# Patient Record
Sex: Female | Born: 1935 | Race: White | Hispanic: No | Smoking: Former smoker
Health system: Southern US, Community
[De-identification: ages and names within clinical notes are randomized; demographics above are authoritative.]

## PROBLEM LIST (undated history)

## (undated) DIAGNOSIS — I219 Acute myocardial infarction, unspecified: Secondary | ICD-10-CM

## (undated) DIAGNOSIS — Z85118 Personal history of other malignant neoplasm of bronchus and lung: Secondary | ICD-10-CM

## (undated) DIAGNOSIS — M810 Age-related osteoporosis without current pathological fracture: Secondary | ICD-10-CM

## (undated) DIAGNOSIS — Z9221 Personal history of antineoplastic chemotherapy: Secondary | ICD-10-CM

## (undated) DIAGNOSIS — I2510 Atherosclerotic heart disease of native coronary artery without angina pectoris: Secondary | ICD-10-CM

## (undated) DIAGNOSIS — K589 Irritable bowel syndrome without diarrhea: Secondary | ICD-10-CM

## (undated) DIAGNOSIS — R413 Other amnesia: Secondary | ICD-10-CM

## (undated) DIAGNOSIS — K5730 Diverticulosis of large intestine without perforation or abscess without bleeding: Secondary | ICD-10-CM

## (undated) DIAGNOSIS — K219 Gastro-esophageal reflux disease without esophagitis: Secondary | ICD-10-CM

## (undated) DIAGNOSIS — Z923 Personal history of irradiation: Secondary | ICD-10-CM

## (undated) DIAGNOSIS — Z87898 Personal history of other specified conditions: Secondary | ICD-10-CM

## (undated) DIAGNOSIS — C3490 Malignant neoplasm of unspecified part of unspecified bronchus or lung: Secondary | ICD-10-CM

## (undated) DIAGNOSIS — J449 Chronic obstructive pulmonary disease, unspecified: Secondary | ICD-10-CM

## (undated) DIAGNOSIS — Z9861 Coronary angioplasty status: Secondary | ICD-10-CM

## (undated) HISTORY — DX: Other amnesia: R41.3

## (undated) HISTORY — DX: Coronary angioplasty status: Z98.61

## (undated) HISTORY — DX: Age-related osteoporosis without current pathological fracture: M81.0

## (undated) HISTORY — DX: Personal history of antineoplastic chemotherapy: Z92.21

## (undated) HISTORY — DX: Personal history of irradiation: Z92.3

## (undated) HISTORY — DX: Personal history of other specified conditions: Z87.898

## (undated) HISTORY — DX: Personal history of other malignant neoplasm of bronchus and lung: Z85.118

## (undated) HISTORY — PX: TONSILLECTOMY AND ADENOIDECTOMY: SHX28

## (undated) HISTORY — DX: Irritable bowel syndrome without diarrhea: K58.9

## (undated) HISTORY — DX: Gastro-esophageal reflux disease without esophagitis: K21.9

## (undated) HISTORY — DX: Atherosclerotic heart disease of native coronary artery without angina pectoris: I25.10

## (undated) HISTORY — DX: Diverticulosis of large intestine without perforation or abscess without bleeding: K57.30

---

## 1978-07-12 HISTORY — PX: ABDOMINAL HYSTERECTOMY: SHX81

## 1995-07-13 DIAGNOSIS — I219 Acute myocardial infarction, unspecified: Secondary | ICD-10-CM

## 1995-07-13 HISTORY — PX: CORONARY ARTERY BYPASS GRAFT: SHX141

## 1995-07-13 HISTORY — DX: Acute myocardial infarction, unspecified: I21.9

## 1998-04-24 ENCOUNTER — Ambulatory Visit (HOSPITAL_COMMUNITY): Admission: RE | Admit: 1998-04-24 | Discharge: 1998-04-24 | Payer: Self-pay | Admitting: *Deleted

## 1999-01-09 ENCOUNTER — Encounter: Payer: Self-pay | Admitting: Internal Medicine

## 1999-01-09 ENCOUNTER — Ambulatory Visit (HOSPITAL_COMMUNITY): Admission: RE | Admit: 1999-01-09 | Discharge: 1999-01-09 | Payer: Self-pay | Admitting: Internal Medicine

## 1999-01-09 HISTORY — PX: BRONCHOSCOPY: SUR163

## 1999-02-26 ENCOUNTER — Encounter (HOSPITAL_COMMUNITY): Admission: RE | Admit: 1999-02-26 | Discharge: 1999-05-27 | Payer: Self-pay | Admitting: Internal Medicine

## 1999-03-04 ENCOUNTER — Ambulatory Visit (HOSPITAL_COMMUNITY): Admission: RE | Admit: 1999-03-04 | Discharge: 1999-03-04 | Payer: Self-pay | Admitting: Internal Medicine

## 1999-03-04 ENCOUNTER — Encounter: Payer: Self-pay | Admitting: Internal Medicine

## 1999-05-05 ENCOUNTER — Encounter: Payer: Self-pay | Admitting: *Deleted

## 1999-05-05 ENCOUNTER — Ambulatory Visit (HOSPITAL_COMMUNITY): Admission: RE | Admit: 1999-05-05 | Discharge: 1999-05-05 | Payer: Self-pay | Admitting: *Deleted

## 1999-05-28 ENCOUNTER — Encounter (HOSPITAL_COMMUNITY): Admission: RE | Admit: 1999-05-28 | Discharge: 1999-08-26 | Payer: Self-pay | Admitting: Internal Medicine

## 1999-10-12 ENCOUNTER — Other Ambulatory Visit: Admission: RE | Admit: 1999-10-12 | Discharge: 1999-10-12 | Payer: Self-pay | Admitting: *Deleted

## 1999-10-20 ENCOUNTER — Encounter: Payer: Self-pay | Admitting: *Deleted

## 1999-10-20 ENCOUNTER — Encounter: Admission: RE | Admit: 1999-10-20 | Discharge: 1999-10-20 | Payer: Self-pay | Admitting: *Deleted

## 2000-05-23 ENCOUNTER — Ambulatory Visit (HOSPITAL_COMMUNITY): Admission: RE | Admit: 2000-05-23 | Discharge: 2000-05-23 | Payer: Self-pay | Admitting: *Deleted

## 2000-05-23 ENCOUNTER — Encounter: Payer: Self-pay | Admitting: *Deleted

## 2000-07-12 DIAGNOSIS — Z85118 Personal history of other malignant neoplasm of bronchus and lung: Secondary | ICD-10-CM

## 2000-07-12 HISTORY — DX: Personal history of other malignant neoplasm of bronchus and lung: Z85.118

## 2000-10-12 ENCOUNTER — Other Ambulatory Visit: Admission: RE | Admit: 2000-10-12 | Discharge: 2000-10-12 | Payer: Self-pay | Admitting: *Deleted

## 2000-11-30 ENCOUNTER — Encounter: Payer: Self-pay | Admitting: Internal Medicine

## 2000-11-30 ENCOUNTER — Ambulatory Visit (HOSPITAL_COMMUNITY): Admission: RE | Admit: 2000-11-30 | Discharge: 2000-11-30 | Payer: Self-pay | Admitting: Internal Medicine

## 2000-11-30 ENCOUNTER — Encounter (INDEPENDENT_AMBULATORY_CARE_PROVIDER_SITE_OTHER): Payer: Self-pay | Admitting: Specialist

## 2000-12-08 ENCOUNTER — Encounter: Payer: Self-pay | Admitting: *Deleted

## 2000-12-08 ENCOUNTER — Encounter: Admission: RE | Admit: 2000-12-08 | Discharge: 2000-12-08 | Payer: Self-pay | Admitting: *Deleted

## 2001-02-27 ENCOUNTER — Ambulatory Visit: Admission: RE | Admit: 2001-02-27 | Discharge: 2001-05-28 | Payer: Self-pay | Admitting: Radiation Oncology

## 2001-06-21 ENCOUNTER — Ambulatory Visit (HOSPITAL_COMMUNITY): Admission: RE | Admit: 2001-06-21 | Discharge: 2001-06-21 | Payer: Self-pay | Admitting: Hematology & Oncology

## 2001-06-21 ENCOUNTER — Encounter: Payer: Self-pay | Admitting: Hematology & Oncology

## 2001-09-01 ENCOUNTER — Ambulatory Visit (HOSPITAL_COMMUNITY): Admission: RE | Admit: 2001-09-01 | Discharge: 2001-09-01 | Payer: Self-pay | Admitting: *Deleted

## 2001-09-01 ENCOUNTER — Encounter: Payer: Self-pay | Admitting: *Deleted

## 2001-09-13 ENCOUNTER — Encounter: Admission: RE | Admit: 2001-09-13 | Discharge: 2001-09-13 | Payer: Self-pay | Admitting: *Deleted

## 2001-09-13 ENCOUNTER — Encounter: Payer: Self-pay | Admitting: *Deleted

## 2002-04-18 ENCOUNTER — Encounter: Payer: Self-pay | Admitting: Hematology & Oncology

## 2002-04-18 ENCOUNTER — Ambulatory Visit (HOSPITAL_COMMUNITY): Admission: RE | Admit: 2002-04-18 | Discharge: 2002-04-18 | Payer: Self-pay | Admitting: Hematology & Oncology

## 2002-06-18 ENCOUNTER — Encounter: Payer: Self-pay | Admitting: *Deleted

## 2002-06-18 ENCOUNTER — Encounter: Admission: RE | Admit: 2002-06-18 | Discharge: 2002-06-18 | Payer: Self-pay | Admitting: *Deleted

## 2003-07-17 ENCOUNTER — Ambulatory Visit (HOSPITAL_COMMUNITY): Admission: RE | Admit: 2003-07-17 | Discharge: 2003-07-17 | Payer: Self-pay | Admitting: Hematology & Oncology

## 2003-07-22 ENCOUNTER — Ambulatory Visit (HOSPITAL_COMMUNITY): Admission: RE | Admit: 2003-07-22 | Discharge: 2003-07-22 | Payer: Self-pay | Admitting: *Deleted

## 2004-06-08 ENCOUNTER — Other Ambulatory Visit: Admission: RE | Admit: 2004-06-08 | Discharge: 2004-06-08 | Payer: Self-pay | Admitting: *Deleted

## 2004-06-19 ENCOUNTER — Ambulatory Visit: Payer: Self-pay | Admitting: Hematology & Oncology

## 2004-07-12 DIAGNOSIS — R413 Other amnesia: Secondary | ICD-10-CM

## 2004-07-12 HISTORY — DX: Other amnesia: R41.3

## 2004-08-24 ENCOUNTER — Ambulatory Visit (HOSPITAL_COMMUNITY): Admission: RE | Admit: 2004-08-24 | Discharge: 2004-08-24 | Payer: Self-pay | Admitting: *Deleted

## 2004-10-16 ENCOUNTER — Ambulatory Visit: Payer: Self-pay | Admitting: Hematology & Oncology

## 2005-01-23 DIAGNOSIS — R413 Other amnesia: Secondary | ICD-10-CM | POA: Insufficient documentation

## 2005-03-09 ENCOUNTER — Ambulatory Visit: Payer: Self-pay | Admitting: Hematology & Oncology

## 2005-05-06 DIAGNOSIS — K5730 Diverticulosis of large intestine without perforation or abscess without bleeding: Secondary | ICD-10-CM

## 2005-05-06 HISTORY — DX: Diverticulosis of large intestine without perforation or abscess without bleeding: K57.30

## 2005-07-16 ENCOUNTER — Ambulatory Visit: Payer: Self-pay | Admitting: Hematology & Oncology

## 2005-10-18 ENCOUNTER — Ambulatory Visit (HOSPITAL_COMMUNITY): Admission: RE | Admit: 2005-10-18 | Discharge: 2005-10-18 | Payer: Self-pay | Admitting: Obstetrics and Gynecology

## 2005-10-22 ENCOUNTER — Ambulatory Visit: Payer: Self-pay | Admitting: Hematology & Oncology

## 2005-10-25 LAB — CBC WITH DIFFERENTIAL/PLATELET
BASO%: 0.3 % (ref 0.0–2.0)
HCT: 44.8 % (ref 34.8–46.6)
MCHC: 34.2 g/dL (ref 32.0–36.0)
MONO#: 0.3 10*3/uL (ref 0.1–0.9)
RBC: 5.06 10*6/uL (ref 3.70–5.32)
WBC: 5.3 10*3/uL (ref 3.9–10.0)
lymph#: 1.9 10*3/uL (ref 0.9–3.3)

## 2005-10-25 LAB — COMPREHENSIVE METABOLIC PANEL
ALT: 15 U/L (ref 0–40)
Albumin: 4.3 g/dL (ref 3.5–5.2)
CO2: 26 mEq/L (ref 19–32)
Calcium: 9.2 mg/dL (ref 8.4–10.5)
Chloride: 107 mEq/L (ref 96–112)
Sodium: 141 mEq/L (ref 135–145)
Total Protein: 6.4 g/dL (ref 6.0–8.3)

## 2006-04-21 ENCOUNTER — Ambulatory Visit: Payer: Self-pay | Admitting: Hematology & Oncology

## 2006-05-06 ENCOUNTER — Ambulatory Visit (HOSPITAL_COMMUNITY): Admission: RE | Admit: 2006-05-06 | Discharge: 2006-05-06 | Payer: Self-pay | Admitting: Hematology & Oncology

## 2006-06-24 ENCOUNTER — Other Ambulatory Visit: Admission: RE | Admit: 2006-06-24 | Discharge: 2006-06-24 | Payer: Self-pay | Admitting: Obstetrics & Gynecology

## 2006-10-19 ENCOUNTER — Ambulatory Visit: Payer: Self-pay | Admitting: Hematology & Oncology

## 2006-10-24 LAB — COMPREHENSIVE METABOLIC PANEL
ALT: 13 U/L (ref 0–35)
CO2: 26 mEq/L (ref 19–32)
Chloride: 105 mEq/L (ref 96–112)
Sodium: 140 mEq/L (ref 135–145)
Total Bilirubin: 0.4 mg/dL (ref 0.3–1.2)
Total Protein: 6.2 g/dL (ref 6.0–8.3)

## 2006-10-24 LAB — CBC WITH DIFFERENTIAL/PLATELET
BASO%: 0.5 % (ref 0.0–2.0)
LYMPH%: 30.6 % (ref 14.0–48.0)
MCHC: 35.3 g/dL (ref 32.0–36.0)
MONO#: 0.3 10*3/uL (ref 0.1–0.9)
RBC: 4.71 10*6/uL (ref 3.70–5.32)
WBC: 5.1 10*3/uL (ref 3.9–10.0)
lymph#: 1.5 10*3/uL (ref 0.9–3.3)

## 2006-11-04 ENCOUNTER — Ambulatory Visit (HOSPITAL_COMMUNITY): Admission: RE | Admit: 2006-11-04 | Discharge: 2006-11-04 | Payer: Self-pay | Admitting: Obstetrics & Gynecology

## 2007-03-21 ENCOUNTER — Emergency Department (HOSPITAL_COMMUNITY): Admission: EM | Admit: 2007-03-21 | Discharge: 2007-03-21 | Payer: Self-pay | Admitting: *Deleted

## 2007-03-21 HISTORY — PX: CLOSED REDUCTION FOREARM FRACTURE: SHX960

## 2007-03-31 ENCOUNTER — Ambulatory Visit (HOSPITAL_COMMUNITY): Admission: RE | Admit: 2007-03-31 | Discharge: 2007-03-31 | Payer: Self-pay | Admitting: Cardiology

## 2007-04-19 ENCOUNTER — Ambulatory Visit (HOSPITAL_COMMUNITY): Admission: RE | Admit: 2007-04-19 | Discharge: 2007-04-19 | Payer: Self-pay | Admitting: Cardiology

## 2007-04-19 ENCOUNTER — Ambulatory Visit: Payer: Self-pay | Admitting: Cardiovascular Disease

## 2007-04-20 ENCOUNTER — Ambulatory Visit: Payer: Self-pay | Admitting: Hematology & Oncology

## 2007-04-24 LAB — COMPREHENSIVE METABOLIC PANEL
ALT: 14 U/L (ref 0–35)
AST: 15 U/L (ref 0–37)
Alkaline Phosphatase: 45 U/L (ref 39–117)
Sodium: 138 mEq/L (ref 135–145)
Total Bilirubin: 0.4 mg/dL (ref 0.3–1.2)
Total Protein: 6.1 g/dL (ref 6.0–8.3)

## 2007-04-24 LAB — CBC WITH DIFFERENTIAL/PLATELET
BASO%: 0.4 % (ref 0.0–2.0)
EOS%: 3 % (ref 0.0–7.0)
LYMPH%: 23.1 % (ref 14.0–48.0)
MCH: 31 pg (ref 26.0–34.0)
MCHC: 35.1 g/dL (ref 32.0–36.0)
MCV: 88.5 fL (ref 81.0–101.0)
MONO#: 0.3 10*3/uL (ref 0.1–0.9)
MONO%: 6.4 % (ref 0.0–13.0)
Platelets: 190 10*3/uL (ref 145–400)
RBC: 4.46 10*6/uL (ref 3.70–5.32)
WBC: 4.8 10*3/uL (ref 3.9–10.0)

## 2007-05-29 ENCOUNTER — Ambulatory Visit (HOSPITAL_COMMUNITY): Admission: RE | Admit: 2007-05-29 | Discharge: 2007-05-29 | Payer: Self-pay | Admitting: Neurology

## 2007-07-13 DIAGNOSIS — Z87898 Personal history of other specified conditions: Secondary | ICD-10-CM

## 2007-07-13 HISTORY — DX: Personal history of other specified conditions: Z87.898

## 2007-07-26 ENCOUNTER — Other Ambulatory Visit: Admission: RE | Admit: 2007-07-26 | Discharge: 2007-07-26 | Payer: Self-pay | Admitting: Obstetrics and Gynecology

## 2007-11-08 ENCOUNTER — Ambulatory Visit: Payer: Self-pay | Admitting: Hematology & Oncology

## 2007-11-13 LAB — CBC WITH DIFFERENTIAL/PLATELET
BASO%: 1 % (ref 0.0–2.0)
Eosinophils Absolute: 0.1 10*3/uL (ref 0.0–0.5)
HCT: 41.9 % (ref 34.8–46.6)
HGB: 14.5 g/dL (ref 11.6–15.9)
MCHC: 34.5 g/dL (ref 32.0–36.0)
MONO#: 0.3 10*3/uL (ref 0.1–0.9)
NEUT#: 2.5 10*3/uL (ref 1.5–6.5)
NEUT%: 57.4 % (ref 39.6–76.8)
WBC: 4.3 10*3/uL (ref 3.9–10.0)
lymph#: 1.4 10*3/uL (ref 0.9–3.3)

## 2007-11-13 LAB — COMPREHENSIVE METABOLIC PANEL
ALT: 21 U/L (ref 0–35)
CO2: 26 mEq/L (ref 19–32)
Calcium: 9.3 mg/dL (ref 8.4–10.5)
Chloride: 107 mEq/L (ref 96–112)
Creatinine, Ser: 0.98 mg/dL (ref 0.40–1.20)
Glucose, Bld: 61 mg/dL — ABNORMAL LOW (ref 70–99)
Total Protein: 6.1 g/dL (ref 6.0–8.3)

## 2007-11-20 ENCOUNTER — Ambulatory Visit (HOSPITAL_COMMUNITY): Admission: RE | Admit: 2007-11-20 | Discharge: 2007-11-20 | Payer: Self-pay | Admitting: Obstetrics & Gynecology

## 2007-11-20 ENCOUNTER — Encounter: Payer: Self-pay | Admitting: Internal Medicine

## 2007-11-22 ENCOUNTER — Other Ambulatory Visit: Admission: RE | Admit: 2007-11-22 | Discharge: 2007-11-22 | Payer: Self-pay | Admitting: Obstetrics & Gynecology

## 2008-01-19 ENCOUNTER — Ambulatory Visit: Payer: Self-pay | Admitting: Internal Medicine

## 2008-01-19 DIAGNOSIS — I219 Acute myocardial infarction, unspecified: Secondary | ICD-10-CM | POA: Insufficient documentation

## 2008-01-19 DIAGNOSIS — M810 Age-related osteoporosis without current pathological fracture: Secondary | ICD-10-CM | POA: Insufficient documentation

## 2008-01-19 DIAGNOSIS — C3492 Malignant neoplasm of unspecified part of left bronchus or lung: Secondary | ICD-10-CM

## 2008-01-19 DIAGNOSIS — J438 Other emphysema: Secondary | ICD-10-CM

## 2008-01-27 DIAGNOSIS — J449 Chronic obstructive pulmonary disease, unspecified: Secondary | ICD-10-CM

## 2008-01-27 DIAGNOSIS — J4489 Other specified chronic obstructive pulmonary disease: Secondary | ICD-10-CM | POA: Insufficient documentation

## 2008-01-29 ENCOUNTER — Encounter: Payer: Self-pay | Admitting: Adult Health

## 2008-01-29 ENCOUNTER — Telehealth (INDEPENDENT_AMBULATORY_CARE_PROVIDER_SITE_OTHER): Payer: Self-pay | Admitting: *Deleted

## 2008-03-01 ENCOUNTER — Ambulatory Visit: Payer: Self-pay | Admitting: Internal Medicine

## 2008-05-10 ENCOUNTER — Ambulatory Visit: Payer: Self-pay | Admitting: Hematology & Oncology

## 2008-05-20 LAB — COMPREHENSIVE METABOLIC PANEL
BUN: 19 mg/dL (ref 6–23)
CO2: 26 mEq/L (ref 19–32)
Calcium: 9.4 mg/dL (ref 8.4–10.5)
Chloride: 102 mEq/L (ref 96–112)
Creatinine, Ser: 0.94 mg/dL (ref 0.40–1.20)
Glucose, Bld: 77 mg/dL (ref 70–99)
Total Bilirubin: 0.5 mg/dL (ref 0.3–1.2)

## 2008-05-20 LAB — CBC WITH DIFFERENTIAL/PLATELET
Basophils Absolute: 0 10*3/uL (ref 0.0–0.1)
HCT: 45.5 % (ref 34.8–46.6)
HGB: 15.5 g/dL (ref 11.6–15.9)
LYMPH%: 31 % (ref 14.0–48.0)
MONO#: 0.4 10*3/uL (ref 0.1–0.9)
NEUT%: 61 % (ref 39.6–76.8)
Platelets: 173 10*3/uL (ref 145–400)
WBC: 5.7 10*3/uL (ref 3.9–10.0)
lymph#: 1.8 10*3/uL (ref 0.9–3.3)

## 2008-05-24 ENCOUNTER — Ambulatory Visit: Payer: Self-pay | Admitting: Hematology & Oncology

## 2008-07-12 DIAGNOSIS — K589 Irritable bowel syndrome without diarrhea: Secondary | ICD-10-CM

## 2008-07-12 HISTORY — DX: Irritable bowel syndrome, unspecified: K58.9

## 2008-07-26 ENCOUNTER — Other Ambulatory Visit: Admission: RE | Admit: 2008-07-26 | Discharge: 2008-07-26 | Payer: Self-pay | Admitting: Obstetrics & Gynecology

## 2008-07-30 ENCOUNTER — Encounter: Admission: RE | Admit: 2008-07-30 | Discharge: 2008-07-30 | Payer: Self-pay | Admitting: Obstetrics & Gynecology

## 2008-11-18 ENCOUNTER — Ambulatory Visit (HOSPITAL_COMMUNITY): Admission: RE | Admit: 2008-11-18 | Discharge: 2008-11-18 | Payer: Self-pay | Admitting: Obstetrics & Gynecology

## 2008-11-22 ENCOUNTER — Ambulatory Visit: Payer: Self-pay | Admitting: Hematology & Oncology

## 2008-11-25 ENCOUNTER — Ambulatory Visit (HOSPITAL_BASED_OUTPATIENT_CLINIC_OR_DEPARTMENT_OTHER): Admission: RE | Admit: 2008-11-25 | Discharge: 2008-11-25 | Payer: Self-pay | Admitting: Hematology & Oncology

## 2008-11-25 ENCOUNTER — Ambulatory Visit: Payer: Self-pay | Admitting: Diagnostic Radiology

## 2008-11-25 LAB — CMP (CANCER CENTER ONLY)
ALT(SGPT): 19 U/L (ref 10–47)
AST: 24 U/L (ref 11–38)
BUN, Bld: 19 mg/dL (ref 7–22)
CO2: 31 mEq/L (ref 18–33)
Calcium: 9.8 mg/dL (ref 8.0–10.3)
Chloride: 100 mEq/L (ref 98–108)
Creat: 1 mg/dl (ref 0.6–1.2)
Total Bilirubin: 0.7 mg/dl (ref 0.20–1.60)

## 2008-11-25 LAB — CBC WITH DIFFERENTIAL (CANCER CENTER ONLY)
BASO%: 0.6 % (ref 0.0–2.0)
HCT: 43.1 % (ref 34.8–46.6)
LYMPH%: 35.5 % (ref 14.0–48.0)
MCH: 30.6 pg (ref 26.0–34.0)
MCV: 90 fL (ref 81–101)
MONO%: 6.8 % (ref 0.0–13.0)
NEUT%: 54.7 % (ref 39.6–80.0)
Platelets: 168 10*3/uL (ref 145–400)
RDW: 11.2 % (ref 10.5–14.6)

## 2009-05-12 ENCOUNTER — Ambulatory Visit: Payer: Self-pay | Admitting: Hematology & Oncology

## 2009-05-14 LAB — CBC WITH DIFFERENTIAL/PLATELET
BASO%: 0.3 % (ref 0.0–2.0)
EOS%: 2.1 % (ref 0.0–7.0)
MCH: 30.6 pg (ref 25.1–34.0)
MCHC: 33.1 g/dL (ref 31.5–36.0)
MONO#: 0.5 10*3/uL (ref 0.1–0.9)
RBC: 4.89 10*6/uL (ref 3.70–5.45)
RDW: 13 % (ref 11.2–14.5)
WBC: 7.4 10*3/uL (ref 3.9–10.3)
lymph#: 2.1 10*3/uL (ref 0.9–3.3)

## 2009-05-15 LAB — COMPREHENSIVE METABOLIC PANEL
ALT: 14 U/L (ref 0–35)
AST: 17 U/L (ref 0–37)
CO2: 29 mEq/L (ref 19–32)
Calcium: 9.5 mg/dL (ref 8.4–10.5)
Chloride: 102 mEq/L (ref 96–112)
Creatinine, Ser: 1.01 mg/dL (ref 0.40–1.20)
Sodium: 138 mEq/L (ref 135–145)
Total Protein: 6.5 g/dL (ref 6.0–8.3)

## 2009-05-15 LAB — VITAMIN D 25 HYDROXY (VIT D DEFICIENCY, FRACTURES): Vit D, 25-Hydroxy: 38 ng/mL (ref 30–89)

## 2009-05-19 ENCOUNTER — Encounter: Admission: RE | Admit: 2009-05-19 | Discharge: 2009-05-19 | Payer: Self-pay | Admitting: Hematology & Oncology

## 2009-05-27 ENCOUNTER — Ambulatory Visit: Payer: Self-pay | Admitting: Hematology & Oncology

## 2009-08-08 ENCOUNTER — Ambulatory Visit (HOSPITAL_COMMUNITY): Admission: RE | Admit: 2009-08-08 | Discharge: 2009-08-08 | Payer: Self-pay | Admitting: Obstetrics & Gynecology

## 2009-08-26 ENCOUNTER — Inpatient Hospital Stay (HOSPITAL_COMMUNITY): Admission: EM | Admit: 2009-08-26 | Discharge: 2009-09-01 | Payer: Self-pay | Admitting: Emergency Medicine

## 2009-08-27 HISTORY — PX: ORIF FEMUR FRACTURE: SHX2119

## 2009-11-20 ENCOUNTER — Ambulatory Visit: Payer: Self-pay | Admitting: Hematology & Oncology

## 2009-11-24 ENCOUNTER — Ambulatory Visit: Payer: Self-pay | Admitting: Diagnostic Radiology

## 2009-11-24 ENCOUNTER — Ambulatory Visit (HOSPITAL_BASED_OUTPATIENT_CLINIC_OR_DEPARTMENT_OTHER): Admission: RE | Admit: 2009-11-24 | Discharge: 2009-11-24 | Payer: Self-pay | Admitting: Hematology & Oncology

## 2009-11-24 LAB — COMPREHENSIVE METABOLIC PANEL
ALT: 14 U/L (ref 0–35)
CO2: 27 mEq/L (ref 19–32)
Calcium: 9.6 mg/dL (ref 8.4–10.5)
Chloride: 103 mEq/L (ref 96–112)
Creatinine, Ser: 0.86 mg/dL (ref 0.40–1.20)
Glucose, Bld: 79 mg/dL (ref 70–99)
Sodium: 137 mEq/L (ref 135–145)
Total Protein: 7 g/dL (ref 6.0–8.3)

## 2009-11-24 LAB — CBC WITH DIFFERENTIAL (CANCER CENTER ONLY)
BASO%: 0.6 % (ref 0.0–2.0)
Eosinophils Absolute: 0.2 10*3/uL (ref 0.0–0.5)
HCT: 43.6 % (ref 34.8–46.6)
LYMPH#: 1.7 10*3/uL (ref 0.9–3.3)
MONO#: 0.3 10*3/uL (ref 0.1–0.9)
NEUT%: 50 % (ref 39.6–80.0)
RBC: 4.97 10*6/uL (ref 3.70–5.32)
RDW: 12.3 % (ref 10.5–14.6)
WBC: 4.3 10*3/uL (ref 3.9–10.0)

## 2009-11-24 LAB — VITAMIN D 25 HYDROXY (VIT D DEFICIENCY, FRACTURES): Vit D, 25-Hydroxy: 33 ng/mL (ref 30–89)

## 2010-03-18 ENCOUNTER — Ambulatory Visit: Payer: Self-pay | Admitting: Hematology & Oncology

## 2010-05-22 ENCOUNTER — Ambulatory Visit: Payer: Self-pay | Admitting: Hematology & Oncology

## 2010-05-25 ENCOUNTER — Ambulatory Visit: Payer: Self-pay | Admitting: Diagnostic Radiology

## 2010-05-25 ENCOUNTER — Ambulatory Visit (HOSPITAL_BASED_OUTPATIENT_CLINIC_OR_DEPARTMENT_OTHER): Admission: RE | Admit: 2010-05-25 | Discharge: 2010-05-25 | Payer: Self-pay | Admitting: Hematology & Oncology

## 2010-05-25 LAB — COMPREHENSIVE METABOLIC PANEL
ALT: 18 U/L (ref 0–35)
AST: 21 U/L (ref 0–37)
Albumin: 3.9 g/dL (ref 3.5–5.2)
Calcium: 9.4 mg/dL (ref 8.4–10.5)
Chloride: 105 mEq/L (ref 96–112)
Potassium: 4.3 mEq/L (ref 3.5–5.3)
Sodium: 140 mEq/L (ref 135–145)

## 2010-05-25 LAB — CBC WITH DIFFERENTIAL (CANCER CENTER ONLY)
BASO%: 0.5 % (ref 0.0–2.0)
EOS%: 3.3 % (ref 0.0–7.0)
LYMPH#: 2.2 10*3/uL (ref 0.9–3.3)
MCHC: 33 g/dL (ref 32.0–36.0)
NEUT#: 2.3 10*3/uL (ref 1.5–6.5)
RDW: 11.4 % (ref 10.5–14.6)

## 2010-08-01 ENCOUNTER — Encounter: Payer: Self-pay | Admitting: Hematology & Oncology

## 2010-08-02 ENCOUNTER — Encounter: Payer: Self-pay | Admitting: Hematology & Oncology

## 2010-09-02 ENCOUNTER — Other Ambulatory Visit: Payer: Self-pay | Admitting: Obstetrics and Gynecology

## 2010-09-02 DIAGNOSIS — Z1231 Encounter for screening mammogram for malignant neoplasm of breast: Secondary | ICD-10-CM

## 2010-09-21 ENCOUNTER — Ambulatory Visit (HOSPITAL_COMMUNITY)
Admission: RE | Admit: 2010-09-21 | Discharge: 2010-09-21 | Disposition: A | Discharge status: Home / Self Care | Payer: Medicare Other | Source: Ambulatory Visit | Attending: Obstetrics and Gynecology | Admitting: Obstetrics and Gynecology

## 2010-09-21 DIAGNOSIS — Z1231 Encounter for screening mammogram for malignant neoplasm of breast: Secondary | ICD-10-CM | POA: Insufficient documentation

## 2010-09-30 LAB — CBC
HCT: 33.5 % — ABNORMAL LOW (ref 36.0–46.0)
HCT: 34 % — ABNORMAL LOW (ref 36.0–46.0)
HCT: 34.6 % — ABNORMAL LOW (ref 36.0–46.0)
HCT: 39.1 % (ref 36.0–46.0)
Hemoglobin: 11.8 g/dL — ABNORMAL LOW (ref 12.0–15.0)
Hemoglobin: 11.8 g/dL — ABNORMAL LOW (ref 12.0–15.0)
Hemoglobin: 13.6 g/dL (ref 12.0–15.0)
MCHC: 34.1 g/dL (ref 30.0–36.0)
MCHC: 34.3 g/dL (ref 30.0–36.0)
MCHC: 34.6 g/dL (ref 30.0–36.0)
MCV: 88.7 fL (ref 78.0–100.0)
MCV: 88.8 fL (ref 78.0–100.0)
MCV: 89 fL (ref 78.0–100.0)
MCV: 89 fL (ref 78.0–100.0)
MCV: 89.2 fL (ref 78.0–100.0)
Platelets: 139 10*3/uL — ABNORMAL LOW (ref 150–400)
Platelets: 153 10*3/uL (ref 150–400)
Platelets: 157 10*3/uL (ref 150–400)
RBC: 4.73 MIL/uL (ref 3.87–5.11)
RDW: 13.8 % (ref 11.5–15.5)
RDW: 14.1 % (ref 11.5–15.5)
RDW: 14.6 % (ref 11.5–15.5)
WBC: 8.1 10*3/uL (ref 4.0–10.5)

## 2010-09-30 LAB — BASIC METABOLIC PANEL
BUN: 10 mg/dL (ref 6–23)
BUN: 13 mg/dL (ref 6–23)
CO2: 31 mEq/L (ref 19–32)
CO2: 31 mEq/L (ref 19–32)
Chloride: 101 mEq/L (ref 96–112)
Chloride: 102 mEq/L (ref 96–112)
Chloride: 102 mEq/L (ref 96–112)
Creatinine, Ser: 0.81 mg/dL (ref 0.4–1.2)
Creatinine, Ser: 0.85 mg/dL (ref 0.4–1.2)
GFR calc Af Amer: >60 mL/min (ref >60)
GFR calc non Af Amer: >60 mL/min (ref >60)
Glucose, Bld: 113 mg/dL — ABNORMAL HIGH (ref 70–99)
Glucose, Bld: 121 mg/dL — ABNORMAL HIGH (ref 70–99)
Glucose, Bld: 140 mg/dL — ABNORMAL HIGH (ref 70–99)
Potassium: 4.7 mEq/L (ref 3.5–5.1)
Potassium: 4.7 mEq/L (ref 3.5–5.1)
Sodium: 136 mEq/L (ref 135–145)
Sodium: 136 mEq/L (ref 135–145)

## 2010-09-30 LAB — T4, FREE: Free T4: 1.26 ng/dL (ref 0.80–1.80)

## 2010-09-30 LAB — APTT: aPTT: 33 seconds (ref 24–37)

## 2010-09-30 LAB — COMPREHENSIVE METABOLIC PANEL
AST: 36 U/L (ref 0–37)
CO2: 26 mEq/L (ref 19–32)
Calcium: 9.2 mg/dL (ref 8.4–10.5)
Creatinine, Ser: 1.06 mg/dL (ref 0.4–1.2)
GFR calc Af Amer: >60 mL/min (ref >60)
GFR calc non Af Amer: 50 mL/min — ABNORMAL LOW (ref >60)

## 2010-09-30 LAB — PROTIME-INR
INR: 0.96 (ref 0.00–1.49)
INR: 2.02 — ABNORMAL HIGH (ref 0.00–1.49)
INR: 2.72 — ABNORMAL HIGH (ref 0.00–1.49)
Prothrombin Time: 12.7 seconds (ref 11.6–15.2)
Prothrombin Time: 28.8 seconds — ABNORMAL HIGH (ref 11.6–15.2)

## 2010-09-30 LAB — CK TOTAL AND CKMB (NOT AT ARMC)
CK, MB: 5.2 ng/mL — ABNORMAL HIGH (ref 0.3–4.0)
Relative Index: 2.6 — ABNORMAL HIGH (ref 0.0–2.5)
Relative Index: 3.3 — ABNORMAL HIGH (ref 0.0–2.5)
Relative Index: 3.4 — ABNORMAL HIGH (ref 0.0–2.5)
Total CK: 156 U/L (ref 7–177)
Total CK: 197 U/L — ABNORMAL HIGH (ref 7–177)

## 2010-09-30 LAB — DIFFERENTIAL
Eosinophils Relative: 0 % (ref 0–5)
Lymphocytes Relative: 21 % (ref 12–46)
Lymphs Abs: 0.9 10*3/uL (ref 0.7–4.0)
Neutro Abs: 3.1 10*3/uL (ref 1.7–7.7)
Neutrophils Relative %: 72 % (ref 43–77)

## 2010-09-30 LAB — URINALYSIS, ROUTINE W REFLEX MICROSCOPIC
Leukocytes, UA: NEGATIVE
Nitrite: NEGATIVE
Protein, ur: NEGATIVE mg/dL
Urobilinogen, UA: 0.2 mg/dL (ref 0.0–1.0)

## 2010-09-30 LAB — TROPONIN I
Troponin I: 0.01 ng/mL (ref 0.00–0.06)
Troponin I: 0.02 ng/mL (ref 0.00–0.06)

## 2010-09-30 LAB — URINE MICROSCOPIC-ADD ON

## 2010-09-30 LAB — TSH: TSH: 0.633 u[IU]/mL (ref 0.350–4.500)

## 2010-11-24 NOTE — Op Note (Signed)
NAME:  Nancy Gray, Nancy Gray NO.:  [id number]   MEDICAL RECORD NO.:  [id number]          PATIENT TYPE:  EMS   LOCATION:  ED                           FACILITY:  [facility]   PHYSICIAN:  Dionne Ano. Gramig III, M.D.DATE OF BIRTH:  07/08/1929   DATE OF PROCEDURE:  03/21/2007  DATE OF DISCHARGE:                               OPERATIVE REPORT   HISTORY:  I had the pleasure to see Ms. Nancy Gray who is a 77 year old  female who is right-hand dominant and fell, today, sustaining a  comminuted complex fracture about her distal radius and ulna, left upper  extremity.  The patient does not recall all of the events of her fall,  but notes that she landed on the outstretched left upper extremity; and  has no other injuries.  She denies neck, back, chest, or abdominal pain.  She does have her family with her including her daughter and  granddaughter.  She is alert and oriented x4.  I should note that she  has a significantly displaced comminuted complex distal radius fracture  with obvious ambulatory deformity.  She has some abrasions ulnarly but  no evidence of an open wound.   PAST MEDICAL HISTORY:  Coronary artery disease, pulmonary lesion with  documented cancer history for which she has been treated with  chemotherapy only.  She states that she is doing quite well, and  continuing her chemotherapy regimen under the direction of Dr. Arlan Organ.  She also has a history of COPD; however, she quit smoking 20  years ago.   PAST SURGICAL HISTORY:  Significant for a CABG x4 in with subsequent  clotting of two of the grafts and the stenting which she was done fairly  well.  She sees Dr. Amil Amen for her cardiac needs.   ALLERGIES:  None.   MEDICINES REVIEWED AND INCLUDE:  1. Alprazolam  2. Aricept 10 mg at bedtime.  3. Aspirin 81 mg daily.  4. Benicar.  5. Cipro 750 mg twice a day.  6. Combivent.  7. Evista 16 mg once a day.  8. Flagyl 500 mg twice a day.  9. Magnesium 1  tablet a day.  10.Paxil.  11.Potassium chloride.  12.Zocor.  13.Vitamins.   SOCIAL HISTORY:  She does not smoke.  She lives alone.  She rarely  enjoys an alcoholic beverage.   PHYSICAL EXAMINATION:  GENERAL:  She is a pleasant female alert and  oriented in no acute distress here with her family.  She is obvious  deformity about the left wrist.  CHEST:  Clear.  HEART:  Regular rate.  ABDOMEN:  Nontender.  EXTREMITIES:  Her right upper extremity has no evidence of infection,  dystrophy, or obvious abnormality.  Left upper extremity has obvious  deformity, peripheral abrasion ulnarly at about the volar aspect of her  wrist, but no obvious open fracture.  She is stable to ligamentous  examination; and has no signs of obvious infection or compartment  syndrome.  NEUROLOGIC:  She is sensate about the hand and does have normal flexion  and extension of the  fingers through a very short arc due to pain.   X-RAYS:  Reviewed which show comminuted complex interarticular distal  radius fracture with associated ulna fracture.   IMPRESSION:  Closed comminuted complex interarticular radius and ulna  fracture greater than five parts.   PLAN:  I have discussed her findings, I have given her a hematoma block  with lidocaine approximately 15 mL were used with aspiration of the  hematoma.  Following adequate anesthesia, she was placed.  In finger-  trap traction and underwent reduction.  Postreduction films showed  restoration of anatomic alignment in terms of radial height, inclination  and volar tilt, I was quite pleased with this.  I discussed with the  patient that should she hold, or have only slight collapse then closed  treatment would certainly be in her best interests given all issues,  including her age, activity level, hand dominance, and medical problems.  However, if she displaces significantly, then operative intervention  would be the next consideration.   I have discussed these  issues with her at length.  She was given Vicodin  for pain.  I have asked the family to stay with her over the next 2-3  days.  I have discussed elevation, ice, finger range of motion, massage,  and other measures.  I have discussed with her these issues at length,  the do's and don'ts etcetera, and all questions have been encouraged and  answered.   She left the hospital alert and oriented with excellent range of motion  to the fingers.  She is noted to understand the postop  protocol/postreduction protocol; and will return to see me in 7 days for  repeat AP and lateral x-rays, in my office, with her splint on.  It has  been a pleasure to see her.           ______________________________  Dionne Ano. Everlene Other, M.D.     Nash Mantis  D:  03/21/2007  T:  03/22/2007  Job:  [id number]   cc:   Bethann Berkshire, MD  [address] [place], [place] [address]

## 2010-12-23 ENCOUNTER — Ambulatory Visit (HOSPITAL_BASED_OUTPATIENT_CLINIC_OR_DEPARTMENT_OTHER)
Admission: RE | Admit: 2010-12-23 | Discharge: 2010-12-23 | Disposition: A | Discharge status: Home / Self Care | Payer: Medicare Other | Source: Ambulatory Visit | Attending: Hematology & Oncology | Admitting: Hematology & Oncology

## 2010-12-23 ENCOUNTER — Other Ambulatory Visit: Payer: Self-pay | Admitting: Hematology & Oncology

## 2010-12-23 ENCOUNTER — Encounter (HOSPITAL_BASED_OUTPATIENT_CLINIC_OR_DEPARTMENT_OTHER): Payer: Medicare Other | Admitting: Hematology & Oncology

## 2010-12-23 DIAGNOSIS — J449 Chronic obstructive pulmonary disease, unspecified: Secondary | ICD-10-CM | POA: Insufficient documentation

## 2010-12-23 DIAGNOSIS — C3410 Malignant neoplasm of upper lobe, unspecified bronchus or lung: Secondary | ICD-10-CM

## 2010-12-23 DIAGNOSIS — C801 Malignant (primary) neoplasm, unspecified: Secondary | ICD-10-CM

## 2010-12-23 DIAGNOSIS — C3490 Malignant neoplasm of unspecified part of unspecified bronchus or lung: Secondary | ICD-10-CM

## 2010-12-23 DIAGNOSIS — J4489 Other specified chronic obstructive pulmonary disease: Secondary | ICD-10-CM | POA: Insufficient documentation

## 2010-12-23 DIAGNOSIS — E559 Vitamin D deficiency, unspecified: Secondary | ICD-10-CM

## 2010-12-23 DIAGNOSIS — D509 Iron deficiency anemia, unspecified: Secondary | ICD-10-CM

## 2010-12-23 LAB — CBC WITH DIFFERENTIAL (CANCER CENTER ONLY)
BASO%: 0.4 % (ref 0.0–2.0)
HCT: 42.9 % (ref 34.8–46.6)
LYMPH%: 35.3 % (ref 14.0–48.0)
MCH: 29.8 pg (ref 26.0–34.0)
MCV: 89 fL (ref 81–101)
MONO#: 0.5 10*3/uL (ref 0.1–0.9)
MONO%: 9.2 % (ref 0.0–13.0)
NEUT%: 52.9 % (ref 39.6–80.0)
RDW: 12.8 % (ref 11.1–15.7)
WBC: 5.6 10*3/uL (ref 3.9–10.0)

## 2010-12-23 LAB — VITAMIN D 25 HYDROXY (VIT D DEFICIENCY, FRACTURES): Vit D, 25-Hydroxy: 37 ng/mL (ref 30–89)

## 2010-12-23 LAB — COMPREHENSIVE METABOLIC PANEL
ALT: 17 U/L (ref 0–35)
CO2: 25 mEq/L (ref 19–32)
Creatinine, Ser: 0.98 mg/dL (ref 0.50–1.10)
Glucose, Bld: 81 mg/dL (ref 70–99)
Total Bilirubin: 0.7 mg/dL (ref 0.3–1.2)

## 2010-12-30 ENCOUNTER — Ambulatory Visit (HOSPITAL_BASED_OUTPATIENT_CLINIC_OR_DEPARTMENT_OTHER)
Admission: RE | Admit: 2010-12-30 | Discharge: 2010-12-30 | Disposition: A | Discharge status: Home / Self Care | Payer: Medicare Other | Source: Ambulatory Visit | Attending: Hematology & Oncology | Admitting: Hematology & Oncology

## 2010-12-30 ENCOUNTER — Ambulatory Visit (INDEPENDENT_AMBULATORY_CARE_PROVIDER_SITE_OTHER)
Admission: RE | Admit: 2010-12-30 | Discharge: 2010-12-30 | Disposition: A | Discharge status: Home / Self Care | Payer: Medicare Other | Source: Ambulatory Visit | Attending: Hematology & Oncology | Admitting: Hematology & Oncology

## 2010-12-30 DIAGNOSIS — E049 Nontoxic goiter, unspecified: Secondary | ICD-10-CM

## 2010-12-30 DIAGNOSIS — R1909 Other intra-abdominal and pelvic swelling, mass and lump: Secondary | ICD-10-CM

## 2010-12-30 DIAGNOSIS — R0602 Shortness of breath: Secondary | ICD-10-CM | POA: Insufficient documentation

## 2010-12-30 DIAGNOSIS — C3490 Malignant neoplasm of unspecified part of unspecified bronchus or lung: Secondary | ICD-10-CM

## 2010-12-30 DIAGNOSIS — R109 Unspecified abdominal pain: Secondary | ICD-10-CM

## 2010-12-30 DIAGNOSIS — I6529 Occlusion and stenosis of unspecified carotid artery: Secondary | ICD-10-CM

## 2010-12-30 DIAGNOSIS — R599 Enlarged lymph nodes, unspecified: Secondary | ICD-10-CM | POA: Insufficient documentation

## 2010-12-30 DIAGNOSIS — K7689 Other specified diseases of liver: Secondary | ICD-10-CM

## 2010-12-30 DIAGNOSIS — J438 Other emphysema: Secondary | ICD-10-CM | POA: Insufficient documentation

## 2010-12-30 DIAGNOSIS — R609 Edema, unspecified: Secondary | ICD-10-CM | POA: Insufficient documentation

## 2010-12-30 MED ORDER — IOHEXOL 300 MG/ML  SOLN
80.0000 mL | Freq: Once | INTRAMUSCULAR | Status: AC | PRN
  Administered 2010-12-30: 80 mL via INTRAVENOUS

## 2011-01-14 ENCOUNTER — Emergency Department (HOSPITAL_BASED_OUTPATIENT_CLINIC_OR_DEPARTMENT_OTHER)
Admission: EM | Admit: 2011-01-14 | Discharge: 2011-01-15 | Disposition: A | Discharge status: Home / Self Care | Payer: Medicare Other | Attending: Emergency Medicine | Admitting: Emergency Medicine

## 2011-01-14 ENCOUNTER — Emergency Department (INDEPENDENT_AMBULATORY_CARE_PROVIDER_SITE_OTHER): Payer: Medicare Other

## 2011-01-14 DIAGNOSIS — Y92009 Unspecified place in unspecified non-institutional (private) residence as the place of occurrence of the external cause: Secondary | ICD-10-CM | POA: Insufficient documentation

## 2011-01-14 DIAGNOSIS — W19XXXA Unspecified fall, initial encounter: Secondary | ICD-10-CM

## 2011-01-14 DIAGNOSIS — R609 Edema, unspecified: Secondary | ICD-10-CM

## 2011-01-14 DIAGNOSIS — S1093XA Contusion of unspecified part of neck, initial encounter: Secondary | ICD-10-CM

## 2011-01-14 DIAGNOSIS — S0083XA Contusion of other part of head, initial encounter: Secondary | ICD-10-CM

## 2011-01-14 DIAGNOSIS — J4489 Other specified chronic obstructive pulmonary disease: Secondary | ICD-10-CM | POA: Insufficient documentation

## 2011-01-14 DIAGNOSIS — E7800 Pure hypercholesterolemia, unspecified: Secondary | ICD-10-CM | POA: Insufficient documentation

## 2011-01-14 DIAGNOSIS — J449 Chronic obstructive pulmonary disease, unspecified: Secondary | ICD-10-CM | POA: Insufficient documentation

## 2011-01-14 DIAGNOSIS — I252 Old myocardial infarction: Secondary | ICD-10-CM | POA: Insufficient documentation

## 2011-01-14 DIAGNOSIS — I10 Essential (primary) hypertension: Secondary | ICD-10-CM | POA: Insufficient documentation

## 2011-01-14 DIAGNOSIS — S060X0A Concussion without loss of consciousness, initial encounter: Secondary | ICD-10-CM | POA: Insufficient documentation

## 2011-01-14 DIAGNOSIS — W010XXA Fall on same level from slipping, tripping and stumbling without subsequent striking against object, initial encounter: Secondary | ICD-10-CM | POA: Insufficient documentation

## 2011-02-12 ENCOUNTER — Encounter (HOSPITAL_BASED_OUTPATIENT_CLINIC_OR_DEPARTMENT_OTHER)
Admission: RE | Admit: 2011-02-12 | Discharge: 2011-02-12 | Disposition: A | Discharge status: Home / Self Care | Payer: Medicare Other | Source: Ambulatory Visit | Attending: Otolaryngology | Admitting: Otolaryngology

## 2011-02-12 LAB — BASIC METABOLIC PANEL
Calcium: 10.1 mg/dL (ref 8.4–10.5)
GFR calc Af Amer: >60 mL/min (ref >60)
GFR calc non Af Amer: 50 mL/min — ABNORMAL LOW (ref >60)
Glucose, Bld: 74 mg/dL (ref 70–99)
Potassium: 4.3 mEq/L (ref 3.5–5.1)
Sodium: 140 mEq/L (ref 135–145)

## 2011-02-16 ENCOUNTER — Ambulatory Visit (HOSPITAL_BASED_OUTPATIENT_CLINIC_OR_DEPARTMENT_OTHER)
Admission: RE | Admit: 2011-02-16 | Discharge: 2011-02-16 | Disposition: A | Discharge status: Home / Self Care | Payer: Medicare Other | Source: Ambulatory Visit | Attending: Otolaryngology | Admitting: Otolaryngology

## 2011-02-16 ENCOUNTER — Other Ambulatory Visit: Payer: Self-pay | Admitting: Otolaryngology

## 2011-02-16 DIAGNOSIS — J4489 Other specified chronic obstructive pulmonary disease: Secondary | ICD-10-CM | POA: Insufficient documentation

## 2011-02-16 DIAGNOSIS — I2510 Atherosclerotic heart disease of native coronary artery without angina pectoris: Secondary | ICD-10-CM | POA: Insufficient documentation

## 2011-02-16 DIAGNOSIS — C770 Secondary and unspecified malignant neoplasm of lymph nodes of head, face and neck: Secondary | ICD-10-CM | POA: Insufficient documentation

## 2011-02-16 DIAGNOSIS — Z85118 Personal history of other malignant neoplasm of bronchus and lung: Secondary | ICD-10-CM | POA: Insufficient documentation

## 2011-02-16 DIAGNOSIS — Z951 Presence of aortocoronary bypass graft: Secondary | ICD-10-CM | POA: Insufficient documentation

## 2011-02-16 DIAGNOSIS — Z79899 Other long term (current) drug therapy: Secondary | ICD-10-CM | POA: Insufficient documentation

## 2011-02-16 DIAGNOSIS — J449 Chronic obstructive pulmonary disease, unspecified: Secondary | ICD-10-CM | POA: Insufficient documentation

## 2011-02-16 DIAGNOSIS — Z01812 Encounter for preprocedural laboratory examination: Secondary | ICD-10-CM | POA: Insufficient documentation

## 2011-02-16 LAB — POCT HEMOGLOBIN-HEMACUE: Hemoglobin: 13.7 g/dL (ref 12.0–15.0)

## 2011-02-19 ENCOUNTER — Other Ambulatory Visit: Payer: Self-pay | Admitting: Hematology & Oncology

## 2011-02-19 DIAGNOSIS — C3410 Malignant neoplasm of upper lobe, unspecified bronchus or lung: Secondary | ICD-10-CM

## 2011-02-24 ENCOUNTER — Encounter (HOSPITAL_COMMUNITY): Payer: Self-pay

## 2011-02-24 ENCOUNTER — Encounter (HOSPITAL_COMMUNITY)
Admission: RE | Admit: 2011-02-24 | Discharge: 2011-02-24 | Disposition: A | Discharge status: Home / Self Care | Payer: Medicare Other | Source: Ambulatory Visit | Attending: Hematology & Oncology | Admitting: Hematology & Oncology

## 2011-02-24 DIAGNOSIS — M76899 Other specified enthesopathies of unspecified lower limb, excluding foot: Secondary | ICD-10-CM | POA: Insufficient documentation

## 2011-02-24 DIAGNOSIS — M19019 Primary osteoarthritis, unspecified shoulder: Secondary | ICD-10-CM | POA: Insufficient documentation

## 2011-02-24 DIAGNOSIS — J438 Other emphysema: Secondary | ICD-10-CM | POA: Insufficient documentation

## 2011-02-24 DIAGNOSIS — R599 Enlarged lymph nodes, unspecified: Secondary | ICD-10-CM | POA: Insufficient documentation

## 2011-02-24 DIAGNOSIS — C3410 Malignant neoplasm of upper lobe, unspecified bronchus or lung: Secondary | ICD-10-CM

## 2011-02-24 DIAGNOSIS — C3490 Malignant neoplasm of unspecified part of unspecified bronchus or lung: Secondary | ICD-10-CM | POA: Insufficient documentation

## 2011-02-24 HISTORY — DX: Malignant neoplasm of unspecified part of unspecified bronchus or lung: C34.90

## 2011-02-24 MED ORDER — FLUDEOXYGLUCOSE F - 18 (FDG) INJECTION
16.8000 | Freq: Once | INTRAVENOUS | Status: AC | PRN
  Administered 2011-02-24: 16.8 via INTRAVENOUS

## 2011-02-25 LAB — GLUCOSE, CAPILLARY: Glucose-Capillary: 56 mg/dL — ABNORMAL LOW (ref 70–99)

## 2011-02-26 NOTE — Op Note (Signed)
  NAME:  Nancy Gray, DAIN NO.:  [id number]  MEDICAL RECORD NO.:  [id number]  LOCATION:                                 FACILITY:  PHYSICIAN:  Kristine Garbe. Ezzard Standing, M.D.DATE OF BIRTH:  1929-01-21  DATE OF PROCEDURE:  02/16/2011 DATE OF DISCHARGE:                              OPERATIVE REPORT   PREOPERATIVE DIAGNOSIS:  Left neck lymphadenopathy.  POSTOPERATIVE DIAGNOSIS:  Left neck lymphadenopathy.  OPERATION PERFORMED:  Excisional biopsy of deep left jugular neck node.  SURGEON:  Kristine Garbe. Ezzard Standing, MD.  ANESTHESIA:  General LMA.  COMPLICATIONS:  None.  BRIEF CLINICAL NOTE:  Nancy Gray is an 77 year old female, who had previously been diagnosed with nonsmall cell lung cancer in 2000.  Over the last several months, she has developed some lymphadenopathy in the left neck, underwent a recent CT scan, which demonstrated two significantly enlarged lymph nodes, one measuring 2.7 cm in size, the other one measuring approximately 2 cm in size.  These are firm to palpation.  She has a previous history of nonsmall-cell lung cancer, which has been metastatic and has responded well to medical therapy.  Because of significant left neck lymphadenopathy, she is referred by Dr. Myna Hidalgo for left neck biopsy.  Of note, upper airway exam was clear on evaluation in the office.  He is elected to excise the mid upper left jugular node just below the submandibular gland.  DESCRIPTION OF PROCEDURE:  After undergoing general anesthesia with LMA, the left neck was prepped with Betadine solution, was draped out with sterile towels.  A horizontal incision was made directly over the enlarged node.  Dissection was carried down through the subcutaneous tissue and platysma muscle.  The node was identified just anterior to the sternocleidomastoid muscle.  Dissection was carried out around the capsule of the node.  Node was fairly vascular.  The superior and inferior feeding vessels  were ligated with 4-0 silk suture and divided. Node was dissected off of the jugular vein deep.  Hemostasis was obtained with bipolar cautery.  Node was removed and sent in saline fresh to pathology.  The defect was closed with 3-0 chromic sutures, reapproximate the subcutaneous tissue and platysma muscle, and 5-0 nylon on the skin edges.  Bacitracin ointment and dressing was applied.  [name] was awoke from anesthesia and transferred to the recovery room.  Postop, doing well.  DISPOSITION:  [name] was discharged home later this morning on Tylenol and Vicodin p.r.n. pain.  We will have her followup in my office in 1 week to remove sutures, recheck wound, and review pathology.          ______________________________ Kristine Garbe Ezzard Standing, M.D.     CEN/MEDQ  D:  02/16/2011  T:  02/16/2011  Job:  [id number]  cc:   Josph Macho, M.D.  Electronically Signed by Dillard Cannon M.D. on 02/26/2011 09:33:29 AM

## 2011-03-24 ENCOUNTER — Ambulatory Visit
Admission: RE | Admit: 2011-03-24 | Discharge: 2011-03-24 | Disposition: A | Discharge status: Home / Self Care | Payer: Medicare Other | Source: Ambulatory Visit | Attending: Radiation Oncology | Admitting: Radiation Oncology

## 2011-03-24 DIAGNOSIS — Z510 Encounter for antineoplastic radiation therapy: Secondary | ICD-10-CM | POA: Insufficient documentation

## 2011-03-24 DIAGNOSIS — C770 Secondary and unspecified malignant neoplasm of lymph nodes of head, face and neck: Secondary | ICD-10-CM | POA: Insufficient documentation

## 2011-03-24 DIAGNOSIS — L299 Pruritus, unspecified: Secondary | ICD-10-CM | POA: Insufficient documentation

## 2011-03-24 DIAGNOSIS — C3410 Malignant neoplasm of upper lobe, unspecified bronchus or lung: Secondary | ICD-10-CM | POA: Insufficient documentation

## 2011-03-24 DIAGNOSIS — L538 Other specified erythematous conditions: Secondary | ICD-10-CM | POA: Insufficient documentation

## 2011-03-24 DIAGNOSIS — R1310 Dysphagia, unspecified: Secondary | ICD-10-CM | POA: Insufficient documentation

## 2011-03-24 DIAGNOSIS — J4489 Other specified chronic obstructive pulmonary disease: Secondary | ICD-10-CM | POA: Insufficient documentation

## 2011-03-24 DIAGNOSIS — J449 Chronic obstructive pulmonary disease, unspecified: Secondary | ICD-10-CM | POA: Insufficient documentation

## 2011-04-23 LAB — POCT CARDIAC MARKERS
Operator id: 1627
Troponin i, poc: <0.05

## 2011-04-23 LAB — BASIC METABOLIC PANEL
BUN: 12
Chloride: 112
Glucose, Bld: 116 — ABNORMAL HIGH
Potassium: 4.2
Sodium: 144

## 2011-04-23 LAB — URINALYSIS, ROUTINE W REFLEX MICROSCOPIC
Glucose, UA: NEGATIVE
Ketones, ur: 15 — AB
pH: 6.5

## 2011-04-23 LAB — DIFFERENTIAL
Eosinophils Absolute: 0
Eosinophils Relative: 1
Lymphs Abs: 1
Monocytes Absolute: 0.2

## 2011-04-23 LAB — CBC
HCT: 40.3
Hemoglobin: 13.8
MCV: 90.2
Platelets: 169
RDW: 13
WBC: 7.2

## 2011-05-13 ENCOUNTER — Encounter: Payer: Self-pay | Admitting: *Deleted

## 2011-05-19 ENCOUNTER — Telehealth: Payer: Self-pay | Admitting: *Deleted

## 2011-05-19 NOTE — Telephone Encounter (Signed)
Pt had called yesterday evening and left a voice message about her left neck with  White  Pimple bumps like, started using neosporin stated by pt, and still using biafine, neosporin seemed to help, this RN called back and left message ,neosporin is ok per Dr. Roselind Messier, if continues to bother her or worsens, call us and we will bring her in sooner to be seen

## 2011-05-25 ENCOUNTER — Telehealth: Payer: Self-pay | Admitting: *Deleted

## 2011-05-26 ENCOUNTER — Encounter: Payer: Self-pay | Admitting: *Deleted

## 2011-05-26 ENCOUNTER — Ambulatory Visit: Payer: Medicare Other | Admitting: Radiation Oncology

## 2011-05-26 NOTE — Telephone Encounter (Signed)
See note

## 2011-06-16 ENCOUNTER — Ambulatory Visit
Admission: RE | Admit: 2011-06-16 | Discharge: 2011-06-16 | Disposition: A | Discharge status: Home / Self Care | Payer: Medicare Other | Source: Ambulatory Visit | Attending: Radiation Oncology | Admitting: Radiation Oncology

## 2011-06-16 ENCOUNTER — Encounter: Payer: Self-pay | Admitting: Radiation Oncology

## 2011-06-16 VITALS — BP 100/62 | HR 64 | Temp 98.4°F | Resp 18 | Wt 146.9 lb

## 2011-06-16 DIAGNOSIS — C3410 Malignant neoplasm of upper lobe, unspecified bronchus or lung: Secondary | ICD-10-CM

## 2011-06-16 HISTORY — DX: Chronic obstructive pulmonary disease, unspecified: J44.9

## 2011-06-16 HISTORY — DX: Acute myocardial infarction, unspecified: I21.9

## 2011-06-16 NOTE — Progress Notes (Signed)
SKIN LOOKING BETTER, NO C/O

## 2011-06-16 NOTE — Progress Notes (Signed)
Pt here f/u isolated  recurrence with left neck, hx metastatic nscca Radiation therapy tx 04/07/11-05/11/11

## 2011-06-17 NOTE — Progress Notes (Signed)
CC:   Josph Macho, M.D.  DIAGNOSIS:  Metastatic non-small cell lung cancer.  INTERVAL SINCE RADIATION THERAPY:  1 month.  NARRATIVE:  Nancy Gray comes in today for routine follow-up of her left neck radiation therapy.  The patient completed 5000 cGy to this area. After the patient's treatment was completed, she did develop a brisk skin reaction which was quite bothersome for the patient for a few days. This did eventually heal with the use of Silvadene.  The patient continues to have some mild sensitivity in her skin but denies any pain. She denies any odynophagia or dysphagia.  The patient's taste is good, and she denies any problems with xerostomia.  The patient's breathing is stable.  PHYSICAL EXAMINATION:  Vital Signs:  The patient's weight is 146 pounds. Blood pressure is 100/62.  Pulse is 64.  Temperature is 98.4. Examination of the lungs reveals them to be clear.  The heart has a regular rhythm and rate.  Examination of the neck area reveals some hyperpigmentation changes and mild dry desquamation along the left neck region.  There is some thickening in the left neck lesion but no discrete adenopathy is palpable at this time.  Examination of the oral cavity and posterior pharynx reveals no secondary infection.  The mucosa is moist.  IMPRESSION AND PLAN:  The patient is making satisfactory progress after her radiation therapy.  The patient will return for routine follow-up in 3 months.  She will continue on Iressa through Dr. Gustavo Lah office.    ______________________________ Billie Lade, Ph.D., M.D. JDK/MEDQ  D:  06/16/2011  T:  06/17/2011  Job:  [id number]

## 2011-06-23 ENCOUNTER — Other Ambulatory Visit: Payer: Self-pay | Admitting: Hematology & Oncology

## 2011-06-23 ENCOUNTER — Other Ambulatory Visit (HOSPITAL_BASED_OUTPATIENT_CLINIC_OR_DEPARTMENT_OTHER): Payer: Medicare Other | Admitting: Lab

## 2011-06-23 ENCOUNTER — Ambulatory Visit (HOSPITAL_BASED_OUTPATIENT_CLINIC_OR_DEPARTMENT_OTHER): Payer: Medicare Other | Admitting: Hematology & Oncology

## 2011-06-23 DIAGNOSIS — C799 Secondary malignant neoplasm of unspecified site: Secondary | ICD-10-CM

## 2011-06-23 DIAGNOSIS — C3410 Malignant neoplasm of upper lobe, unspecified bronchus or lung: Secondary | ICD-10-CM

## 2011-06-23 DIAGNOSIS — E559 Vitamin D deficiency, unspecified: Secondary | ICD-10-CM

## 2011-06-23 DIAGNOSIS — C801 Malignant (primary) neoplasm, unspecified: Secondary | ICD-10-CM

## 2011-06-23 DIAGNOSIS — D509 Iron deficiency anemia, unspecified: Secondary | ICD-10-CM

## 2011-06-23 LAB — COMPREHENSIVE METABOLIC PANEL
ALT: 16 U/L (ref 0–35)
AST: 20 U/L (ref 0–37)
AST: 20 U/L (ref 0–37)
Albumin: 4.1 g/dL (ref 3.5–5.2)
Alkaline Phosphatase: 49 U/L (ref 39–117)
BUN: 25 mg/dL — ABNORMAL HIGH (ref 6–23)
BUN: 25 mg/dL — ABNORMAL HIGH (ref 6–23)
CO2: 28 mEq/L (ref 19–32)
CO2: 28 mEq/L (ref 19–32)
Calcium: 9.8 mg/dL (ref 8.4–10.5)
Calcium: 9.8 mg/dL (ref 8.4–10.5)
Calcium: 9.8 mg/dL (ref 8.4–10.5)
Chloride: 103 mEq/L (ref 96–112)
Chloride: 103 mEq/L (ref 96–112)
Chloride: 103 mEq/L (ref 96–112)
Creatinine, Ser: 1.07 mg/dL (ref 0.50–1.10)
Creatinine, Ser: 1.07 mg/dL (ref 0.50–1.10)
Creatinine, Ser: 1.07 mg/dL (ref 0.50–1.10)
Glucose, Bld: 81 mg/dL (ref 70–99)
Glucose, Bld: 81 mg/dL (ref 70–99)
Potassium: 4.6 mEq/L (ref 3.5–5.3)
Potassium: 4.6 mEq/L (ref 3.5–5.3)
Sodium: 139 mEq/L (ref 135–145)
Total Bilirubin: 0.5 mg/dL (ref 0.3–1.2)
Total Protein: 6.6 g/dL (ref 6.0–8.3)

## 2011-06-23 LAB — CBC WITH DIFFERENTIAL (CANCER CENTER ONLY)
BASO#: 0 10*3/uL (ref 0.0–0.2)
BASO%: 0.3 % (ref 0.0–2.0)
Eosinophils Absolute: 0.1 10*3/uL (ref 0.0–0.5)
HCT: 45.1 % (ref 34.8–46.6)
HGB: 15.3 g/dL (ref 11.6–15.9)
LYMPH#: 1.3 10*3/uL (ref 0.9–3.3)
LYMPH%: 20.8 % (ref 14.0–48.0)
MCV: 91 fL (ref 81–101)
MONO#: 0.5 10*3/uL (ref 0.1–0.9)
NEUT%: 69.4 % (ref 39.6–80.0)
RDW: 13.8 % (ref 11.1–15.7)
WBC: 6.4 10*3/uL (ref 3.9–10.0)

## 2011-06-23 LAB — LACTATE DEHYDROGENASE
LDH: 142 U/L (ref 94–250)
LDH: 142 U/L (ref 94–250)

## 2011-06-23 LAB — VITAMIN D 25 HYDROXY (VIT D DEFICIENCY, FRACTURES): Vit D, 25-Hydroxy: 45 ng/mL (ref 30–89)

## 2011-06-23 NOTE — Progress Notes (Signed)
This office note has been dictated.

## 2011-06-23 NOTE — Progress Notes (Signed)
DIAGNOSIS:  Recurrent squamous cell carcinoma of the lung versus a 2nd primary (unknown primary of left neck).  CURRENT THERAPY: 1. The patient is status post radiation therapy to the left neck. 2. Iressa 250 mg p.o. daily.  INTERIM HISTORY:  Ms. Bitterman comes in for followup.  She has been incredibly busy since we last saw her.  I actually saw her back in June. At that point in time, she did have some fullness in the left neck.  As such, a workup ensued.  She did undergo scans.  She underwent a CT scan. This was done on 12/30/2010.  This did show a 2.2 x 2.7 cm left cervical lymph node.  Also noted was a 2.1 x 2 cm left cervical lymph node. There was no evidence of thoracic or abdominal/pelvic lesions.  She really had no symptoms.  There was no hoarseness.  We then referred her to Dr. Narda Bonds.  Dr. Ezzard Standing  went ahead and did an excisional biopsy of the lymph nodes.  The pathology report ([id number]) showed squamous cell carcinoma.  Of course, the pathologist could not tell me that this was a lung primary or possibly a head and neck primary.  We then took her to PET scan.  The PET scan only showed hypermetabolic activity in the left neck.  At that point in time, it was unclear as to whether or not she had metastatic disease which would be very unusual since she has been in remission for over 8 years.  Her lung primary, was in the right lung. This cervical lymphadenopathy is on the left side.  She underwent curative radiation.  Dr. Roselind Messier saw her for this.  He treated her with radiation therapy.  She did incredibly well.  She received 5000 rad that was completed on October 30th.  She did have some skin breakdown.  Otherwise, she had no dysphasia or odynophagia.  She ate well.  There is no xerostomia.  She has had no cough.  She really looks good.  She has not had any nausea or vomiting. She has had some diarrhea.  She continues on Iressa.  I agree with this. Again, it is not  known whether this is actual metastatic disease to the lung or a 2nd primary.  PHYSICAL EXAM:  General:  This is an elderly but well-nourished white female in no obvious distress.  Vital Signs:  Temperature 98.3, pulse 63, respiratory rate 18, blood pressure 137/67.  Weight is 146. Head/Neck:  Exam shows a normocephalic, atraumatic skull.  There are no ocular or oral lesions.  She has some firmness over the left neck.  No discrete adenopathy is noted in the left neck.  Lungs:  Clear to percussion and auscultation bilaterally.  Cardiac:  Regular rate and rhythm with a normal S1, S2.  There are no murmurs, rubs, or bruits. Abdomen: Soft with good bowel sounds.  There is no palpable abdominal mass.  There is no fluid wave.  There is no palpable hepatosplenomegaly. Back:  No tenderness is noted over the spine, ribs, or hips. Extremities:  No clubbing, cyanosis or edema.  She has good range of motion of her joints.  She has 4/5 strength bilaterally.  Skin:  Exam shows some slight radiation dermatitis over the left neck, but this is minimal.  LABORATORY STUDIES:  White cell count is ...  Dictation Ends Here.    ______________________________ Josph Macho, M.D. PRE/MEDQ  D:  06/23/2011  T:  06/23/2011  Job:  [id number]

## 2011-06-24 ENCOUNTER — Telehealth: Payer: Self-pay | Admitting: *Deleted

## 2011-06-24 NOTE — Telephone Encounter (Signed)
Left Bonita Quin note 07-14-11 PET

## 2011-07-08 ENCOUNTER — Telehealth: Payer: Self-pay | Admitting: Hematology & Oncology

## 2011-07-08 NOTE — Telephone Encounter (Signed)
Pt called and cx 07/16/11 apt and resch for 08/12/11

## 2011-07-14 ENCOUNTER — Ambulatory Visit (HOSPITAL_BASED_OUTPATIENT_CLINIC_OR_DEPARTMENT_OTHER)
Admission: RE | Admit: 2011-07-14 | Discharge: 2011-07-14 | Disposition: A | Discharge status: Home / Self Care | Payer: Medicare Other | Source: Ambulatory Visit | Attending: Hematology & Oncology | Admitting: Hematology & Oncology

## 2011-07-14 DIAGNOSIS — K5730 Diverticulosis of large intestine without perforation or abscess without bleeding: Secondary | ICD-10-CM | POA: Insufficient documentation

## 2011-07-14 DIAGNOSIS — C801 Malignant (primary) neoplasm, unspecified: Secondary | ICD-10-CM

## 2011-07-14 DIAGNOSIS — C799 Secondary malignant neoplasm of unspecified site: Secondary | ICD-10-CM

## 2011-07-14 DIAGNOSIS — C3410 Malignant neoplasm of upper lobe, unspecified bronchus or lung: Secondary | ICD-10-CM | POA: Insufficient documentation

## 2011-07-14 DIAGNOSIS — R599 Enlarged lymph nodes, unspecified: Secondary | ICD-10-CM | POA: Insufficient documentation

## 2011-07-14 DIAGNOSIS — I7090 Unspecified atherosclerosis: Secondary | ICD-10-CM | POA: Insufficient documentation

## 2011-07-14 MED ORDER — FLUDEOXYGLUCOSE F - 18 (FDG) INJECTION
13.8800 | Freq: Once | INTRAVENOUS | Status: AC | PRN
  Administered 2011-07-14: 13.88 via INTRAVENOUS

## 2011-07-16 ENCOUNTER — Ambulatory Visit: Payer: Medicare Other | Admitting: Hematology & Oncology

## 2011-07-19 ENCOUNTER — Other Ambulatory Visit: Payer: Self-pay | Admitting: Hematology & Oncology

## 2011-07-19 DIAGNOSIS — C3490 Malignant neoplasm of unspecified part of unspecified bronchus or lung: Secondary | ICD-10-CM

## 2011-07-19 DIAGNOSIS — E890 Postprocedural hypothyroidism: Secondary | ICD-10-CM

## 2011-07-20 ENCOUNTER — Telehealth: Payer: Self-pay | Admitting: *Deleted

## 2011-07-20 NOTE — Telephone Encounter (Signed)
Per MD left pt message moved 1-31 to 4-8

## 2011-07-22 ENCOUNTER — Ambulatory Visit: Payer: Medicare Other | Admitting: Hematology & Oncology

## 2011-08-12 ENCOUNTER — Ambulatory Visit: Payer: Medicare Other | Admitting: Hematology & Oncology

## 2011-10-03 IMAGING — PT NM PET TUM IMG RESTAG (PS) SKULL BASE T - THIGH
6 series · 25 of 25 positions shown · non-contrast
Comparison: Prior CTs of 12/30/2010.  Most recent PET of
07/17/2003.

CLINICAL DATA: Subsequent treatment strategy for restaging for
recurrent lung cancer.  Cancerous nodes in the neck.  Biopsy last
week.

NUCLEAR MEDICINE PET CT SKULL BASE TO THIGH
TECHNIQUE: 16.8 mCi F-18 FDG was injected intravenously via the
right AC.  Full-ring PET imaging was performed from the skull base
through the mid-thighs 90  minutes after injection.  CT data was
obtained and used for attenuation correction and anatomic
localization only.  (This was not acquired as a diagnostic CT
examination.)
Fasting Blood Glucose:  56

[Series 1: pet ac · axial · 3.3mm · 4.69mm/px · z∈[-870,+0]mm · 5 of 267 slices shown]
[im 1/267]
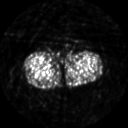
[im 67/267]
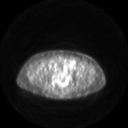
[im 134/267]
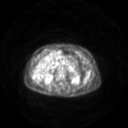
[im 200/267]
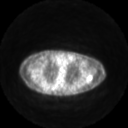
[im 267/267]
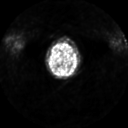

[Series 2: ct images · axial · 3.8mm · 0.98mm/px · z∈[-870,+0]mm · 6 of 267 slices shown]
[im 1/267]
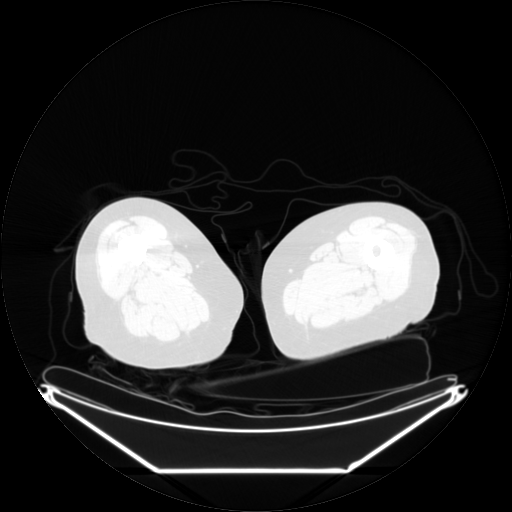
[im 54/267]
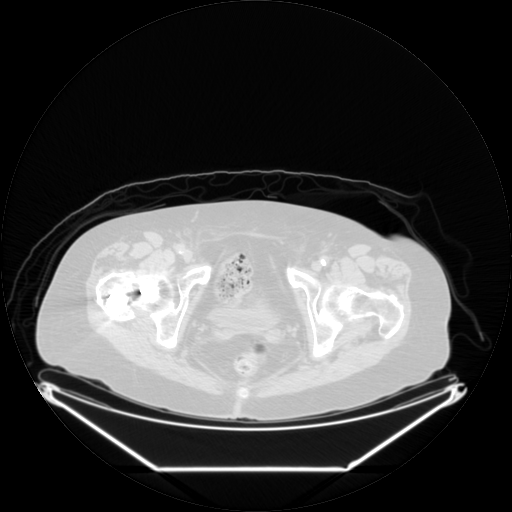
[im 107/267]
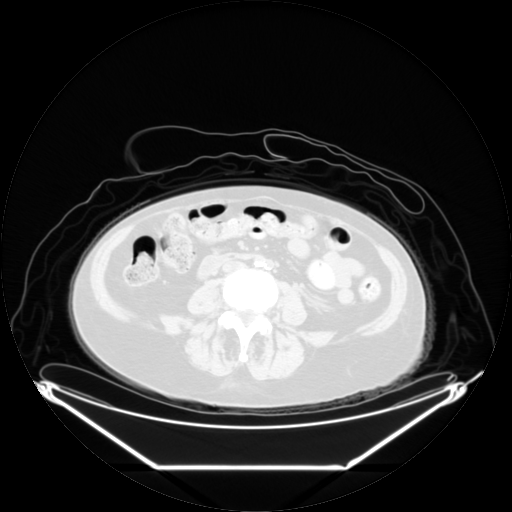
[im 160/267]
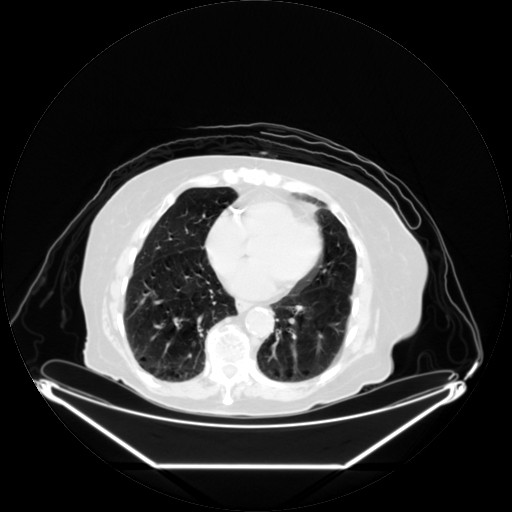
[im 213/267]
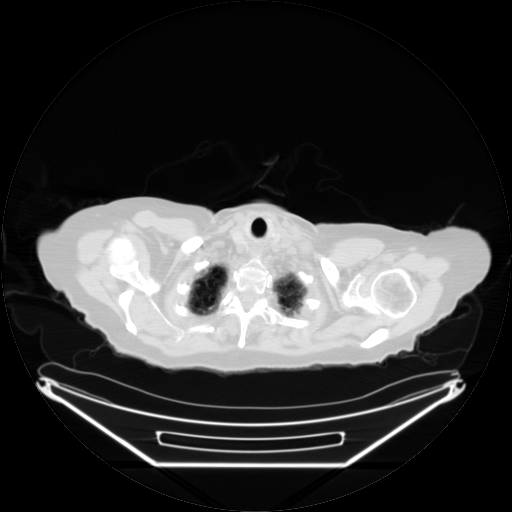
[im 267/267  brain]
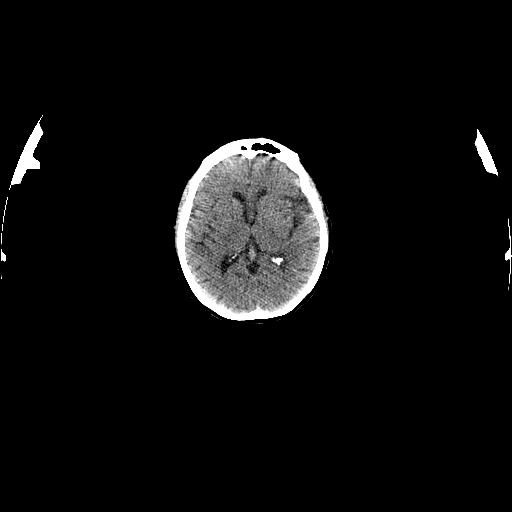

[Series 2: pet nac · axial · 3.3mm · 4.69mm/px · z∈[-870,+0]mm · 6 of 267 slices shown]
[im 1/267]
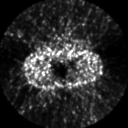
[im 54/267]
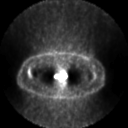
[im 107/267]
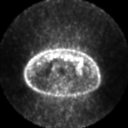
[im 160/267]
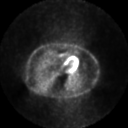
[im 213/267]
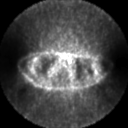
[im 267/267]
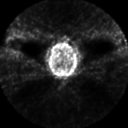

[Series 123: mip · coronal · 3.3mm · 4.69mm/px · 1 of 30 slices shown]
[im 1/30]
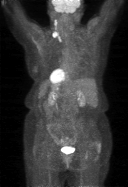

[Series 151: reformatted · axial · 3.3mm · 3.91mm/px · z∈[-870,+0]mm · 6 of 265 slices shown (1 of 2)]
[im 1/265]
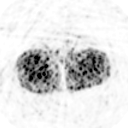
[im 53/265]
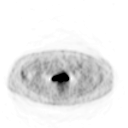
[im 106/265]
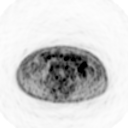
[im 159/265]
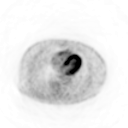
[im 212/265]
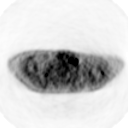
[im 265/265]
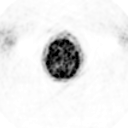

[Series 153: reformatted · coronal · 4.7mm · 6.98mm/px · 1 of 63 slices shown (2 of 2)]
[im 1/63]
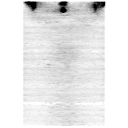

[25 of 25 positions shown; findings below may reference images not displayed]

FINDINGS: PET images demonstrate hypermetabolic left-sided cervical
nodes.  A left-sided level II node measures 1.1 cm and a S.U.V. max
of 10.6 on image 34.

Left-sided level III - IV  node measures 2.6 cm and a S.U.V. max of
18.2 on image 51.  This chain of nodes extends to immediately
adjacent the left lobe of thyroid but is separate.

Left rotator cuff arthropathy.  No other abnormal activity within
the chest.  No abnormal activity within the abdomen.  Right-sided
trochanteric bursitis.

CT images performed for attenuation correction demonstrate no right-
sided cervical adenopathy. Prior median sternotomy.  Centrilobular
emphysema with non hypermetabolic right upper lobe linear scarring,
including on image 75. Cholelithiasis.  Wall thrombus versus focal
dissection within the infrarenal abdominal aorta.  Favor focal
dissection on prior contrast enhanced CT.  Image 155 today.
Hysterectomy.  Right hip arthroplasty.  Other findings deferred to
prior diagnostic CTs.
IMPRESSION: 1.  Left-sided hypermetabolic cervical adenopathy.  Given the
clinical history, this is presumably related to recurrent lung
carcinoma.  Lymphoma or an otherwise occult head neck primary could
look similar.
2.  No evidence of extra-cervical disease.

## 2011-10-18 ENCOUNTER — Ambulatory Visit (HOSPITAL_BASED_OUTPATIENT_CLINIC_OR_DEPARTMENT_OTHER): Payer: Medicare Other | Admitting: Hematology & Oncology

## 2011-10-18 ENCOUNTER — Ambulatory Visit: Payer: Medicare Other | Admitting: Hematology & Oncology

## 2011-10-18 VITALS — BP 133/67 | HR 69 | Temp 97.1°F | Ht 67.0 in | Wt 147.0 lb

## 2011-10-18 DIAGNOSIS — R599 Enlarged lymph nodes, unspecified: Secondary | ICD-10-CM

## 2011-10-18 DIAGNOSIS — C3410 Malignant neoplasm of upper lobe, unspecified bronchus or lung: Secondary | ICD-10-CM

## 2011-10-18 DIAGNOSIS — C801 Malignant (primary) neoplasm, unspecified: Secondary | ICD-10-CM

## 2011-10-18 NOTE — Progress Notes (Signed)
This office note has been dictated.

## 2011-10-19 ENCOUNTER — Telehealth: Payer: Self-pay | Admitting: Hematology & Oncology

## 2011-10-19 NOTE — Progress Notes (Signed)
DIAGNOSIS:  Recurrent squamous cell carcinoma of the lung versus primary head and neck cancer of the left neck.  CURRENT THERAPY:  Iressa 250 mg p.o. daily.  INTERVAL HISTORY:  Nancy Gray comes in for followup.  We last saw her back in December.  Her PET scan was done in January.  The PET scan did not show any evidence of residual activity within the left side of the neck.  There is no evidence of disease elsewhere.  She feels well. She has a little bit of tenderness on the left side of the neck.  She is swallowing okay.  Appetite has been good.  She has had no problem with cough.  She has had no issues with abdominal pain.  There has been no change in bowel or bladder habits.  PHYSICAL EXAMINATION:  This is a elderly but well-nourished white female in no obvious distress.  Vital signs:  Temperature of 97.1, pulse 69, respiratory rate 20, blood pressure 133/67.  Weight is 147.  Head and neck exam shows a normocephalic, atraumatic skull.  There is some hyperpigmentation over on the left neck.  She has a biopsy scars are well-healed on the left neck.  No lymphadenopathy noted in the neck. There are no supraclavicular lymph nodes.  There are no oral lesions. Lungs:  Clear bilaterally.  Cardiac:  Regular rhythm with a normal S1, S2.  There are no murmurs, rubs or bruits.  Abdomen:  Soft with  good bowel sounds.  There is no palpable abdominal mass.  There is no fluid wave.  There is no palpable hepatosplenomegaly.  Back:  No tenderness over the spine, ribs, or hips.  Extremities:  No clubbing, cyanosis or edema.  Neurologic:  No focal neurological deficits.  LABORATORY STUDIES:  Not done this visit.  IMPRESSION:  Nancy Gray is an 78 year old white female with either recurrent squamous cell carcinoma of the lung or a new 2nd primary. This presented with left cervical lymphadenopathy.  She underwent radiation therapy with a PET scan showing that she had a complete response.  We will go  ahead and plan to get her back to see [place] in another few months.  Will see about getting another PET scan set up for an evaluation.  I think that if this next PET scan is negative, then we can follow without doing further scans unless she has symptoms that would suggest recurrence.    ______________________________ Josph Macho, M.D. PRE/MEDQ  D:  10/18/2011  T:  10/19/2011  Job:  [id number]

## 2011-10-19 NOTE — Telephone Encounter (Signed)
Left pt message to call for June appointments °

## 2011-10-20 ENCOUNTER — Telehealth: Payer: Self-pay | Admitting: Hematology & Oncology

## 2011-10-20 NOTE — Telephone Encounter (Signed)
Pt did not want PET scan, per MD that's fine. Pt aware of 6-10 MD appointment

## 2011-12-15 ENCOUNTER — Other Ambulatory Visit (HOSPITAL_BASED_OUTPATIENT_CLINIC_OR_DEPARTMENT_OTHER): Payer: Medicare Other

## 2011-12-20 ENCOUNTER — Other Ambulatory Visit (HOSPITAL_BASED_OUTPATIENT_CLINIC_OR_DEPARTMENT_OTHER): Payer: Medicare Other | Admitting: Lab

## 2011-12-20 ENCOUNTER — Ambulatory Visit (HOSPITAL_BASED_OUTPATIENT_CLINIC_OR_DEPARTMENT_OTHER): Payer: Medicare Other | Admitting: Hematology & Oncology

## 2011-12-20 VITALS — BP 113/64 | HR 67 | Temp 97.7°F | Ht 67.0 in | Wt 145.0 lb

## 2011-12-20 DIAGNOSIS — C3490 Malignant neoplasm of unspecified part of unspecified bronchus or lung: Secondary | ICD-10-CM

## 2011-12-20 DIAGNOSIS — E890 Postprocedural hypothyroidism: Secondary | ICD-10-CM

## 2011-12-20 DIAGNOSIS — R5383 Other fatigue: Secondary | ICD-10-CM

## 2011-12-20 DIAGNOSIS — C801 Malignant (primary) neoplasm, unspecified: Secondary | ICD-10-CM

## 2011-12-20 DIAGNOSIS — C3410 Malignant neoplasm of upper lobe, unspecified bronchus or lung: Secondary | ICD-10-CM

## 2011-12-20 DIAGNOSIS — C770 Secondary and unspecified malignant neoplasm of lymph nodes of head, face and neck: Secondary | ICD-10-CM

## 2011-12-20 NOTE — Progress Notes (Signed)
This office note has been dictated.

## 2011-12-21 NOTE — Progress Notes (Signed)
CC:   Nancy Gray  DIAGNOSIS:  Recurrent squamous cell carcinoma (lung versus new primary head and neck).  CURRENT THERAPY:  Iressa 250 mg p.o. daily.  INTERVAL HISTORY:  Nancy Gray comes in for followup.  She now getting ready for [facility] to open up this weekend.  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  She has had no complaints.  She has had no issues with neck pain.  She has had no cough.  Her appetite has been okay.  She has had no change in bowel or bladder habits.  There has been no leg swelling.  She has had no headache.  She has had no bleeding or bruising.  Her memory continues to be an issue.  She is on Aricept for this.  She has not been bothered by the weather, to date.  However, she is hoping that it is not going to be a wet summer for the campers.  PHYSICAL EXAM:  General: This is an elderly white female in no obvious distress.  Vital signs:  Temperature of 97.7.  Pulse is 67, respiratory rate 20, blood pressure 113/64, weight is 145.  Head and neck exam: Shows a normocephalic, atraumatic skull.  There are no ocular or oral lesions.  There are no palpable cervical or supraclavicular lymph nodes. Lungs:  Clear bilaterally.  Cardiac examination:  Regular rate and rhythm with normal S1 and S2.  There are no murmurs, rubs or bruits. Abdominal exam:  Soft with good bowel sounds.  There is no palpable abdominal mass.  There is no palpable hepatosplenomegaly.  Extremities: Show no clubbing, cyanosis or edema.  Neurological exam:  Shows no focal neurological deficits.  LABORATORY STUDIES:  Pending.  IMPRESSION:  Nancy Gray is an 78 year old white female who had a remote history of squamous cell carcinoma of the lung.  She is on Iressa.  She then presented with lymphadenopathy in the neck.  This is the left side of the neck.  This was biopsied and found to be squamous cell.  The pathologist could not tell if this was recurrent from the lung  cancer, or a primary head and neck cancer.  She was treated with radiation therapy.  She is back in remission.  I do not see anything on her physical exam that would suggest recurrence.  We will go ahead and plan to get her back in 3 more months.  I do not see a need for any scans on her right now.    ______________________________ Josph Macho, M.D. PRE/MEDQ  D:  12/20/2011  T:  12/21/2011  Job:  [id number]

## 2011-12-29 ENCOUNTER — Ambulatory Visit: Payer: Medicare Other | Admitting: Hematology & Oncology

## 2011-12-29 ENCOUNTER — Other Ambulatory Visit: Payer: Medicare Other | Admitting: Lab

## 2011-12-30 ENCOUNTER — Telehealth: Payer: Self-pay | Admitting: Hematology & Oncology

## 2011-12-30 NOTE — Telephone Encounter (Addendum)
Message copied by Cathi Roan on Thu Dec 30, 2011 11:59 AM ------      Message from: Arlan Organ R      Created: Wed Dec 22, 2011  6:47 PM       Please call and let her know that her thyroid test is normal. Nancy Gray  12-30-11  12:00 pm, Called patient on home phone, left message regarding above MD message, with instructions for patient to call us back with any questions.   [email or web address]

## 2012-02-04 ENCOUNTER — Telehealth: Payer: Self-pay | Admitting: *Deleted

## 2012-02-04 NOTE — Telephone Encounter (Signed)
Per correspondence from Dr. Myna Hidalgo, may continue with processing annual prescription for [facility] Clinical Access Program at this time.  Spoke with patient by phone who states that she has plenty of medication on hand, at least enough for a three-month supply, therefore will process prescription next month and request patient resupply for October 1st.

## 2012-02-22 ENCOUNTER — Encounter: Payer: Self-pay | Admitting: *Deleted

## 2012-02-22 NOTE — Progress Notes (Signed)
Annual prescription for [facility] CAP signed by Dr. Myna Hidalgo today and faxed to [facility] for processing.  Received telephone call from Joann at [facility] specialty pharmacy this afternoon confirming the prescription. Prescription will be processed for shipping to patient by needed date of October 1st.

## 2012-03-20 ENCOUNTER — Ambulatory Visit (HOSPITAL_BASED_OUTPATIENT_CLINIC_OR_DEPARTMENT_OTHER): Payer: Medicare Other | Admitting: Hematology & Oncology

## 2012-03-20 ENCOUNTER — Ambulatory Visit (HOSPITAL_BASED_OUTPATIENT_CLINIC_OR_DEPARTMENT_OTHER): Payer: Medicare Other | Admitting: Lab

## 2012-03-20 DIAGNOSIS — R5381 Other malaise: Secondary | ICD-10-CM

## 2012-03-20 DIAGNOSIS — C3410 Malignant neoplasm of upper lobe, unspecified bronchus or lung: Secondary | ICD-10-CM

## 2012-03-20 DIAGNOSIS — E890 Postprocedural hypothyroidism: Secondary | ICD-10-CM

## 2012-03-20 DIAGNOSIS — C801 Malignant (primary) neoplasm, unspecified: Secondary | ICD-10-CM

## 2012-03-20 LAB — PREALBUMIN: Prealbumin: 25.4 mg/dL (ref 17.0–34.0)

## 2012-03-20 LAB — COMPREHENSIVE METABOLIC PANEL
ALT: 19 U/L (ref 0–35)
Albumin: 4.3 g/dL (ref 3.5–5.2)
Alkaline Phosphatase: 45 U/L (ref 39–117)
CO2: 27 mEq/L (ref 19–32)
Glucose, Bld: 97 mg/dL (ref 70–99)
Potassium: 4.6 mEq/L (ref 3.5–5.3)
Sodium: 140 mEq/L (ref 135–145)
Total Protein: 6.7 g/dL (ref 6.0–8.3)

## 2012-03-20 LAB — CBC WITH DIFFERENTIAL (CANCER CENTER ONLY)
BASO%: 0.9 % (ref 0.0–2.0)
Eosinophils Absolute: 0.2 10*3/uL (ref 0.0–0.5)
MONO#: 0.4 10*3/uL (ref 0.1–0.9)
MONO%: 9.7 % (ref 0.0–13.0)
NEUT#: 2.7 10*3/uL (ref 1.5–6.5)
Platelets: 150 10*3/uL (ref 145–400)
RBC: 4.87 10*6/uL (ref 3.70–5.32)
WBC: 4.3 10*3/uL (ref 3.9–10.0)

## 2012-03-20 LAB — TSH: TSH: 2.098 u[IU]/mL (ref 0.350–4.500)

## 2012-03-20 NOTE — Progress Notes (Signed)
This office note has been dictated.

## 2012-03-21 ENCOUNTER — Telehealth: Payer: Self-pay | Admitting: Hematology & Oncology

## 2012-03-21 NOTE — Telephone Encounter (Signed)
Mailed December schedule °

## 2012-03-21 NOTE — Progress Notes (Signed)
CC:   Nancy Gray, M.D.  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  CURRENT THERAPY:  Iressa 250 mg p.o. daily.  INTERIM HISTORY:  Nancy Gray come in for followup.  When we saw her 3 months ago.  She had a good summer.  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  Nancy Gray is doing okay.  She is starting to "slow down."  Thankfully, she has not fallen.  She gets around with a cane.  Her memory seems to be doing a little bit better. She is on Aricept for this now.  She does have some bruising.  She is on aspirin.  There has been no neck pain.  She has not noticed any swollen lymph glands.  She has had no cough.  She has had no change in bowel or bladder habits.  PHYSICAL EXAMINATION:  This is an elderly, somewhat petite white female in no obvious distress.  Vital signs:  97.7, pulse 69, respiratory rate 18, blood pressure 134/63.  Weight is 146.  Head and neck: Normocephalic, atraumatic skull.  There are no ocular or oral lesions. There are no palpable cervical or supraclavicular lymph nodes.  Lungs: Clear bilaterally.  Cardiac:  Regular rate and rhythm with a normal S1 and S2.  There are no murmurs, rubs or bruits.  Abdomen:  Soft with good bowel sounds.  There is no palpable abdominal mass.  There is no palpable hepatosplenomegaly. Extremities:  No clubbing, cyanosis or edema.  Neurological:  No focal neurological deficits.  LABORATORY STUDIES:  White cell count 4.3, hemoglobin 15.1, hematocrit 44.3, platelet count 150.  IMPRESSION:  Nancy Gray is an 78 year old white female with locally recurrent/new squamous cell carcinoma.  Biopsies could not tell us if this was recurrent lung or a primary head and neck cancer.  I do not see any evidence of recurrent disease.  Clinically, she has no evidence of recurrence.  I do not think we have to do any scans on her for right now.  She is pretty much  asymptomatic and I do not think that putting her through scans would really make a difference at this point in time.  I want see her back in 3 more months.    ______________________________ Josph Macho, M.D. PRE/MEDQ  D:  03/20/2012  T:  03/21/2012  Job:  [id number]

## 2012-03-22 ENCOUNTER — Telehealth: Payer: Self-pay | Admitting: *Deleted

## 2012-03-22 NOTE — Telephone Encounter (Signed)
Message copied by Anselm Jungling on Wed Mar 22, 2012 10:18 AM ------      Message from: Nancy Gray      Created: Tue Mar 21, 2012  8:00 PM       Call - labs all look good. THyroid is still working well!!  Cindee Lame

## 2012-03-22 NOTE — Telephone Encounter (Signed)
Called patient to let her know that her labwork looked good and thyroid still working well per dr. Myna Hidalgo

## 2012-06-19 ENCOUNTER — Ambulatory Visit (HOSPITAL_BASED_OUTPATIENT_CLINIC_OR_DEPARTMENT_OTHER): Payer: Medicare Other | Admitting: Lab

## 2012-06-19 ENCOUNTER — Ambulatory Visit (HOSPITAL_BASED_OUTPATIENT_CLINIC_OR_DEPARTMENT_OTHER): Payer: Medicare Other | Admitting: Hematology & Oncology

## 2012-06-19 DIAGNOSIS — C3490 Malignant neoplasm of unspecified part of unspecified bronchus or lung: Secondary | ICD-10-CM

## 2012-06-19 DIAGNOSIS — C770 Secondary and unspecified malignant neoplasm of lymph nodes of head, face and neck: Secondary | ICD-10-CM

## 2012-06-19 DIAGNOSIS — C3410 Malignant neoplasm of upper lobe, unspecified bronchus or lung: Secondary | ICD-10-CM

## 2012-06-19 DIAGNOSIS — C801 Malignant (primary) neoplasm, unspecified: Secondary | ICD-10-CM

## 2012-06-19 DIAGNOSIS — E890 Postprocedural hypothyroidism: Secondary | ICD-10-CM

## 2012-06-19 LAB — COMPREHENSIVE METABOLIC PANEL
ALT: 16 U/L (ref 0–35)
AST: 20 U/L (ref 0–37)
Albumin: 4 g/dL (ref 3.5–5.2)
Alkaline Phosphatase: 42 U/L (ref 39–117)
BUN: 22 mg/dL (ref 6–23)
Chloride: 105 mEq/L (ref 96–112)
Potassium: 4.3 mEq/L (ref 3.5–5.3)

## 2012-06-19 LAB — CBC WITH DIFFERENTIAL (CANCER CENTER ONLY)
BASO#: 0 10*3/uL (ref 0.0–0.2)
EOS%: 2 % (ref 0.0–7.0)
Eosinophils Absolute: 0.1 10*3/uL (ref 0.0–0.5)
HGB: 14.9 g/dL (ref 11.6–15.9)
LYMPH%: 32 % (ref 14.0–48.0)
MCH: 30.4 pg (ref 26.0–34.0)
MCHC: 33.5 g/dL (ref 32.0–36.0)
MCV: 91 fL (ref 81–101)
MONO%: 7.2 % (ref 0.0–13.0)
Platelets: 149 10*3/uL (ref 145–400)
RBC: 4.9 10*6/uL (ref 3.70–5.32)

## 2012-06-19 NOTE — Progress Notes (Signed)
This office note has been dictated.

## 2012-06-21 NOTE — Progress Notes (Signed)
CC:   [name]. Andrey Campanile, M.D.  DIAGNOSIS:  Recurrent squamous cell carcinoma (lung versus head and neck squamous cell).  CURRENT THERAPY:  Iressa 250 mg p.o. daily.  INTERIM HISTORY:  Ms. Townley comes in for her followup.  She is doing okay.  She had a decent year.  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  She was quite happy about that.  She has had no problems with cough.  There was no pruritus.  She has had no change in bowel or bladder habits.  She is having some urinary issues.  She has an Science writer.  She was wondering if this was safe for her to take.  I told her I did not see any problems with this.  However, I referred her to her family doctor to see if he would have any problems with her being on this.  She has had no bleeding.  She has had no fever.  There has been no bony pain.  She has had no leg swelling.  PHYSICAL EXAMINATION:  General:  This is an elderly white female in no obvious distress.  Vital signs:  Show temperature of 97.4, pulse 71, respiratory rate 18, blood pressure 134/60.  Weight is 143.  Head and neck:  Shows a normocephalic, atraumatic skull.  There are no ocular or oral lesions.  There are no palpable cervical or supraclavicular lymph nodes.  There is some slight hyperpigmentation in the left in the lower cervical/supraclavicular region where she had her radiation.  Again, no lymphadenopathy was appreciated.  Lungs:  Clear bilaterally.  Cardiac: Regular rate and rhythm with a normal S1 and S2.  There are no murmurs, rubs or bruits.  Abdomen:  Soft with good bowel sounds.  There is no palpable abdominal mass.  There is no palpable hepatosplenomegaly. Extremities:  Show no clubbing, cyanosis or edema.  She has good range of motion of her joints.  Neurological:  Shows no focal neurological deficits.  LABORATORY STUDIES:  White cell count is 5.6, hemoglobin 15, hematocrit 44.5, platelet count 149.  IMPRESSION:   Ms. Swanton is an 78 year old white female with a history of recurrent squamous cell carcinoma.  Again, it is unknown whether this is lung or a primary head and neck cancer.  Regardless, she was treated with radiation therapy.  She completed this I think back in November of 2012.  She got 5000 RAD.  Clinically, there is no evidence of recurrence.  I still do not see that we need to put her through scans as she is asymptomatic.  We will go ahead and plan to get her back in 4 months.  I do not see that we need any blood work or x-rays in-between visits.    ______________________________ Josph Macho, M.D. PRE/MEDQ  D:  06/19/2012  T:  06/20/2012  Job:  [id number]

## 2012-06-23 ENCOUNTER — Telehealth: Payer: Self-pay | Admitting: *Deleted

## 2012-06-23 NOTE — Telephone Encounter (Addendum)
Message copied by Mirian Capuchin on Fri Jun 23, 2012 12:22 PM ------      Message from: Arlan Organ R      Created: Thu Jun 22, 2012  4:24 PM       Call - labs look ok!!!  Merry Christmas!!!  Cindee Lame This message given to patient.  Voiced understanding.

## 2012-09-07 ENCOUNTER — Other Ambulatory Visit (HOSPITAL_COMMUNITY): Payer: Self-pay | Admitting: Obstetrics and Gynecology

## 2012-09-18 ENCOUNTER — Ambulatory Visit (HOSPITAL_COMMUNITY)
Admission: RE | Admit: 2012-09-18 | Discharge: 2012-09-18 | Disposition: A | Discharge status: Home / Self Care | Payer: Medicare HMO | Source: Ambulatory Visit | Attending: Obstetrics and Gynecology | Admitting: Obstetrics and Gynecology

## 2012-09-18 DIAGNOSIS — Z1231 Encounter for screening mammogram for malignant neoplasm of breast: Secondary | ICD-10-CM | POA: Insufficient documentation

## 2012-10-09 ENCOUNTER — Ambulatory Visit (HOSPITAL_BASED_OUTPATIENT_CLINIC_OR_DEPARTMENT_OTHER): Payer: Medicare HMO | Admitting: Hematology & Oncology

## 2012-10-09 ENCOUNTER — Other Ambulatory Visit (HOSPITAL_BASED_OUTPATIENT_CLINIC_OR_DEPARTMENT_OTHER): Payer: Medicare HMO | Admitting: Lab

## 2012-10-09 DIAGNOSIS — C801 Malignant (primary) neoplasm, unspecified: Secondary | ICD-10-CM

## 2012-10-09 DIAGNOSIS — E032 Hypothyroidism due to medicaments and other exogenous substances: Secondary | ICD-10-CM

## 2012-10-09 LAB — COMPREHENSIVE METABOLIC PANEL
Albumin: 3.7 g/dL (ref 3.5–5.2)
Alkaline Phosphatase: 51 U/L (ref 39–117)
BUN: 20 mg/dL (ref 6–23)
CO2: 28 mEq/L (ref 19–32)
Calcium: 9.6 mg/dL (ref 8.4–10.5)
Chloride: 102 mEq/L (ref 96–112)
Glucose, Bld: 76 mg/dL (ref 70–99)
Potassium: 4.3 mEq/L (ref 3.5–5.3)
Sodium: 141 mEq/L (ref 135–145)
Total Protein: 5.9 g/dL — ABNORMAL LOW (ref 6.0–8.3)

## 2012-10-09 LAB — CBC WITH DIFFERENTIAL (CANCER CENTER ONLY)
BASO#: 0 10*3/uL (ref 0.0–0.2)
Eosinophils Absolute: 0.2 10*3/uL (ref 0.0–0.5)
HCT: 45.2 % (ref 34.8–46.6)
LYMPH#: 1.4 10*3/uL (ref 0.9–3.3)
MCH: 30.5 pg (ref 26.0–34.0)
MONO#: 0.5 10*3/uL (ref 0.1–0.9)
MONO%: 9.9 % (ref 0.0–13.0)
NEUT#: 2.8 10*3/uL (ref 1.5–6.5)
Platelets: 140 10*3/uL — ABNORMAL LOW (ref 145–400)

## 2012-10-09 LAB — TSH: TSH: 2.188 u[IU]/mL (ref 0.350–4.500)

## 2012-10-09 NOTE — Progress Notes (Signed)
This office note has been dictated.

## 2012-10-10 NOTE — Progress Notes (Signed)
CC:   Nancy Gray. Andrey Campanile, M.D.  DIAGNOSIS:  Recurrent squamous cell carcinoma.  CURRENT THERAPY:  Iressa 250 mg p.o. daily.  INTERIM HISTORY:  Nancy Gray comes in for her followup.  We see her every 4 months.  She completed her radiation therapy for the recurrent squamous cell carcinoma back in November 2012.  She feels okay.  She does have some chronic health issues that are not related to her past malignancy.  She has some tremors.  She has some neuropathy.  There are no swallowing difficulties.  She has had no cough.  She has had no bleeding.  There has been no change in bowel or bladder habits.  She is using a cane now.  She is not falling, which is nice to see.  PHYSICAL EXAMINATION:  General:  This is an elderly-appearing white female in no obvious distress.  Vital signs:  Temperature of 97.6, pulse 70, respiratory rate 16, blood pressure 145/61.  Weight is 142.  Head and neck:  Normocephalic, atraumatic skull.  There are no ocular or oral lesions.  There are no palpable cervical or supraclavicular lymph nodes. Lungs:  Clear bilaterally.  Cardiac:  Regular rate and rhythm with a normal S1 and S2.  There are no murmurs, rubs, or bruits.  Abdomen: Soft with good bowel sounds.  There is no palpable abdominal mass. There is no palpable hepatosplenomegaly.  Extremities:  No clubbing, cyanosis, or edema.  Neurological:  Some slight tremors in her hands bilaterally.  LABORATORY STUDIES:  White cell count is 4.9, hemoglobin 15.1, hematocrit 45.2, platelet count 140.  IMPRESSION:  Nancy Gray is a very charming 79 year old white female with recurrent squamous cell carcinoma.  We have never been able to figure out if this was a lung recurrence or a primary head and neck cancer. She was treated with curative radiation therapy.  She did well with this.  So far, I have not seen any kind of consequences from this.  We are checking her thyroid to make sure that  radiation-induced hypothyroidism is not a problem.  We will go ahead and plan to get her back in another 4 months.  I told her that once we get through this year, then we can go every 6 months.    ______________________________ Josph Macho, M.D. PRE/MEDQ  D:  10/09/2012  T:  10/10/2012  Job:  [id number]

## 2012-10-12 ENCOUNTER — Telehealth: Payer: Self-pay | Admitting: *Deleted

## 2012-10-12 NOTE — Telephone Encounter (Signed)
Called patient to let her know that her labs and thyroid levels all look fantastic. Per dr Myna Hidalgo

## 2012-10-12 NOTE — Telephone Encounter (Signed)
Message copied by Anselm Jungling on Thu Oct 12, 2012  2:28 PM ------      Message from: Arlan Organ R      Created: Wed Oct 11, 2012  6:25 PM       Please call and let her that her labs and thyroid levels all look fantastic. Thanks. Pete ------

## 2012-10-18 ENCOUNTER — Other Ambulatory Visit: Payer: Medicare Other | Admitting: Lab

## 2012-10-18 ENCOUNTER — Ambulatory Visit: Payer: Medicare Other | Admitting: Hematology & Oncology

## 2012-10-27 ENCOUNTER — Encounter: Payer: Self-pay | Admitting: *Deleted

## 2012-10-30 ENCOUNTER — Encounter: Payer: Self-pay | Admitting: *Deleted

## 2012-10-30 ENCOUNTER — Telehealth: Payer: Self-pay | Admitting: Certified Nurse Midwife

## 2012-10-30 ENCOUNTER — Ambulatory Visit: Payer: Self-pay | Admitting: Certified Nurse Midwife

## 2012-10-30 DIAGNOSIS — Z01419 Encounter for gynecological examination (general) (routine) without abnormal findings: Secondary | ICD-10-CM

## 2012-10-30 NOTE — Telephone Encounter (Signed)
Pt. cancel appointment for today due to insurance. Pt has [employer] and we do not accept this insurance

## 2012-11-30 ENCOUNTER — Telehealth: Payer: Self-pay | Admitting: Certified Nurse Midwife

## 2012-11-30 NOTE — Telephone Encounter (Signed)
Hi Jessica, I believe this patient was charged a [facility] fee in error. She came in and was not seen due to insurance reason. Please waived the fee.

## 2012-12-11 ENCOUNTER — Ambulatory Visit: Payer: Self-pay | Admitting: Neurology

## 2012-12-18 ENCOUNTER — Ambulatory Visit: Payer: Self-pay | Admitting: Neurology

## 2013-01-03 ENCOUNTER — Encounter (HOSPITAL_COMMUNITY): Payer: Self-pay | Admitting: Emergency Medicine

## 2013-01-03 ENCOUNTER — Emergency Department (HOSPITAL_COMMUNITY)
Admission: EM | Admit: 2013-01-03 | Discharge: 2013-01-04 | Disposition: A | Discharge status: Home / Self Care | Payer: Medicare HMO | Attending: Emergency Medicine | Admitting: Emergency Medicine

## 2013-01-03 DIAGNOSIS — I252 Old myocardial infarction: Secondary | ICD-10-CM | POA: Insufficient documentation

## 2013-01-03 DIAGNOSIS — Z79899 Other long term (current) drug therapy: Secondary | ICD-10-CM | POA: Insufficient documentation

## 2013-01-03 DIAGNOSIS — I10 Essential (primary) hypertension: Secondary | ICD-10-CM | POA: Insufficient documentation

## 2013-01-03 DIAGNOSIS — Z85118 Personal history of other malignant neoplasm of bronchus and lung: Secondary | ICD-10-CM | POA: Insufficient documentation

## 2013-01-03 DIAGNOSIS — Z951 Presence of aortocoronary bypass graft: Secondary | ICD-10-CM | POA: Insufficient documentation

## 2013-01-03 DIAGNOSIS — J449 Chronic obstructive pulmonary disease, unspecified: Secondary | ICD-10-CM | POA: Insufficient documentation

## 2013-01-03 DIAGNOSIS — R300 Dysuria: Secondary | ICD-10-CM | POA: Insufficient documentation

## 2013-01-03 DIAGNOSIS — R350 Frequency of micturition: Secondary | ICD-10-CM | POA: Insufficient documentation

## 2013-01-03 DIAGNOSIS — Z7982 Long term (current) use of aspirin: Secondary | ICD-10-CM | POA: Insufficient documentation

## 2013-01-03 DIAGNOSIS — Z8679 Personal history of other diseases of the circulatory system: Secondary | ICD-10-CM | POA: Insufficient documentation

## 2013-01-03 DIAGNOSIS — J4489 Other specified chronic obstructive pulmonary disease: Secondary | ICD-10-CM | POA: Insufficient documentation

## 2013-01-03 DIAGNOSIS — Z87891 Personal history of nicotine dependence: Secondary | ICD-10-CM | POA: Insufficient documentation

## 2013-01-03 NOTE — ED Notes (Addendum)
Pt states she has not felt well for over a week and this evening she became very jittery and she checked her BP. It was 204/102. Pt in NAD. Pt denies hx of HTN. Pt also c/o pressure in the lower abdomen. Has been having issues with frequent urination.

## 2013-01-04 LAB — URINALYSIS, ROUTINE W REFLEX MICROSCOPIC
Glucose, UA: NEGATIVE mg/dL
Ketones, ur: NEGATIVE mg/dL
Leukocytes, UA: NEGATIVE
Protein, ur: NEGATIVE mg/dL
Urobilinogen, UA: 0.2 mg/dL (ref 0.0–1.0)

## 2013-01-04 LAB — POCT I-STAT, CHEM 8
Calcium, Ion: 1.18 mmol/L (ref 1.13–1.30)
Chloride: 107 mEq/L (ref 96–112)
Creatinine, Ser: 1 mg/dL (ref 0.50–1.10)
Glucose, Bld: 96 mg/dL (ref 70–99)
Potassium: 4.1 mEq/L (ref 3.5–5.1)

## 2013-01-04 LAB — CBC
HCT: 43.4 % (ref 36.0–46.0)
MCHC: 34.1 g/dL (ref 30.0–36.0)
MCV: 88.8 fL (ref 78.0–100.0)
Platelets: 154 10*3/uL (ref 150–400)
RDW: 13 % (ref 11.5–15.5)
WBC: 4.8 10*3/uL (ref 4.0–10.5)

## 2013-01-04 LAB — URINE MICROSCOPIC-ADD ON

## 2013-01-04 MED ORDER — SODIUM CHLORIDE 0.9 % IV BOLUS (SEPSIS)
1000.0000 mL | Freq: Once | INTRAVENOUS | Status: AC
  Administered 2013-01-04: 1000 mL via INTRAVENOUS

## 2013-01-04 NOTE — ED Provider Notes (Signed)
History    CSN: [id number] Arrival date & time 01/03/13  2207  First MD Initiated Contact with Patient 01/04/13 0008     Chief Complaint  Patient presents with  . Hypertension   (Consider location/radiation/quality/duration/timing/severity/associated sxs/prior Treatment) Patient is a 79 y.o. female presenting with hypertension.  Hypertension Pertinent negatives include no chest pain, no abdominal pain, no headaches and no shortness of breath.   History provided by patient. Not feeling well today at home, states that she feels jittery all day long. She denies any other symptoms. No chest pain, shortness of breath, nausea, vomiting. No weakness or numbness. She does have ongoing frequency of urination. She recently had urinalysis blood work done reportedly normal.  Tonight her neighbor brought her in for evaluation after checking her blood pressure at home and noting it was elevated in the range of 200/100. She is currently not being treated for hypertension, is followed by cardiology for previous history of MI and CABG. Symptoms mild to moderate severity. No known history of thyroid problems. She did start Aricept about a week ago, no other new medications.  Past Medical History  Diagnosis Date  . Lung cancer   . COPD (chronic obstructive pulmonary disease)   . MI (myocardial infarction)   . Heart attack   . History of abnormal Pap smear 2009  . History of lung cancer 2002   Past Surgical History  Procedure Laterality Date  . Cardiac bypass     No family history on file. History  Substance Use Topics  . Smoking status: Former Smoker    Quit date: 06/15/1986  . Smokeless tobacco: Never Used  . Alcohol Use: Yes     Comment: occasionally   OB History   Grav Para Term Preterm Abortions TAB SAB Ect Mult Living                 Review of Systems  Constitutional: Negative for fever and chills.  HENT: Negative for neck pain and neck stiffness.   Eyes: Negative for pain.   Respiratory: Negative for shortness of breath.   Cardiovascular: Negative for chest pain.  Gastrointestinal: Negative for abdominal pain.  Genitourinary: Negative for dysuria.  Musculoskeletal: Negative for back pain.  Skin: Negative for rash.  Neurological: Negative for tremors, syncope, speech difficulty and headaches.  All other systems reviewed and are negative.    Allergies  Review of patient's allergies indicates no known allergies.  Home Medications   Current Outpatient Rx  Name  Route  Sig  Dispense  Refill  . albuterol-ipratropium (COMBIVENT) 18-103 MCG/ACT inhaler   Inhalation   Inhale 2 puffs into the lungs 4 (four) times daily.           Marland Kitchen ALPRAZolam (XANAX) 0.5 MG tablet   Oral   Take 0.5 mg by mouth at bedtime.           Marland Kitchen aluminum-magnesium hydroxide 200-200 MG/5ML suspension   Oral   Take by mouth as needed. 2 tab; not taking within 2 hours of Iressa dose         . aspirin 81 MG tablet   Oral   Take 81 mg by mouth every other day.           . B Complex Vitamins (B COMPLEX PO)   Oral   Take by mouth daily.          . calcium carbonate (OS-CAL) 600 MG TABS   Oral   Take 600 mg by mouth 2 (  two) times daily with a meal.           . Coenzyme Q10 (COQ-10 PO)   Oral   Take 100 mg by mouth daily.           . CRESTOR 5 MG tablet      5 mg daily.          Marland Kitchen donepezil (ARICEPT) 10 MG tablet   Oral   Take 10 mg by mouth daily.           . Garlic TABS   Oral   Take 1 tablet by mouth daily.           Marland Kitchen gefitinib (IRESSA) 250 MG tablet   Oral   Take 250 mg by mouth daily. Started in October 2003.          . Ibandronate Sodium (BONIVA) 2.5 MG TABS   Oral   Take 2.5 mg by mouth. Every 1 month; started in 2008          . Multiple Vitamins-Minerals (CENTRUM PO)   Oral   Take 1 tablet by mouth daily.           . Omega-3 Fatty Acids (FISH OIL PO)   Oral   Take 1,000 mg by mouth daily. Px super epa fish oil caps            BP 182/80  Pulse 78  Temp(Src) 98.7 F (37.1 C) (Oral)  Resp 18  Ht 5\' 5"  (1.651 m)  Wt 145 lb (65.772 kg)  BMI 24.13 kg/m2  SpO2 98% Physical Exam  Constitutional: She is oriented to person, place, and time. She appears well-developed and well-nourished.  HENT:  Head: Normocephalic and atraumatic.  Eyes: EOM are normal. Pupils are equal, round, and reactive to light.  Neck: Neck supple.  Cardiovascular: Regular rhythm and intact distal pulses.   Pulmonary/Chest: Effort normal. No respiratory distress.  Musculoskeletal: Normal range of motion. She exhibits no edema and no tenderness.  Neurological: She is alert and oriented to person, place, and time.  Mild upper extremity tremor. No facial asymmetry or pronator drift. No upper extremity or lower deficits with equal strengths and sensorium to light touch  Skin: Skin is warm and dry.    ED Course  Procedures (including critical care time)  Results for orders placed during the hospital encounter of 01/03/13  URINALYSIS, ROUTINE W REFLEX MICROSCOPIC      Result Value Range   Color, Urine YELLOW  YELLOW   APPearance CLEAR  CLEAR   Specific Gravity, Urine 1.012  1.005 - 1.030   pH 7.0  5.0 - 8.0   Glucose, UA NEGATIVE  NEGATIVE mg/dL   Hgb urine dipstick TRACE (*) NEGATIVE   Bilirubin Urine NEGATIVE  NEGATIVE   Ketones, ur NEGATIVE  NEGATIVE mg/dL   Protein, ur NEGATIVE  NEGATIVE mg/dL   Urobilinogen, UA 0.2  0.0 - 1.0 mg/dL   Nitrite NEGATIVE  NEGATIVE   Leukocytes, UA NEGATIVE  NEGATIVE  CBC      Result Value Range   WBC 4.8  4.0 - 10.5 K/uL   RBC 4.89  3.87 - 5.11 MIL/uL   Hemoglobin 14.8  12.0 - 15.0 g/dL   HCT 16.1  09.6 - 04.5 %   MCV 88.8  78.0 - 100.0 fL   MCH 30.3  26.0 - 34.0 pg   MCHC 34.1  30.0 - 36.0 g/dL   RDW 40.9  81.1 - 91.4 %   Platelets  154  150 - 400 K/uL  URINE MICROSCOPIC-ADD ON      Result Value Range   Squamous Epithelial / LPF RARE  RARE   WBC, UA 0-2  <3 WBC/hpf   RBC / HPF 0-2  <3  RBC/hpf   Bacteria, UA RARE  RARE  POCT I-STAT, CHEM 8      Result Value Range   Sodium 139  135 - 145 mEq/L   Potassium 4.1  3.5 - 5.1 mEq/L   Chloride 107  96 - 112 mEq/L   BUN 18  6 - 23 mg/dL   Creatinine, Ser 1.61  0.50 - 1.10 mg/dL   Glucose, Bld 96  70 - 99 mg/dL   Calcium, Ion 0.96  0.45 - 1.30 mmol/L   TCO2 22  0 - 100 mmol/L   Hemoglobin 15.0  12.0 - 15.0 g/dL   HCT 40.9  81.1 - 91.4 %   IV fluids provided.  1. Hypertension    Recheck - patient states she's feeling better and is requesting to be discharged home. She prefers followup with her primary care physician and agrees to have her blood pressure rechecked and be evaluated sooner for any worsening or concerning symptoms.  MDM  Elevated blood pressure at home and feeling "jittery"  Evaluated with urinalysis and labs reviewed as above  Treated with IV fluids  Cardiac monitoring / blood pressure remains elevated in the ED  Vital signs nursing notes reviewed and considered  Sunnie Nielsen, MD 01/04/13 [phone]

## 2013-02-05 ENCOUNTER — Telehealth: Payer: Self-pay | Admitting: *Deleted

## 2013-02-05 NOTE — Telephone Encounter (Signed)
02/05/2013 Per Viviann Spare at [facility] [phone], patient's last shipment (last refill on prescription) was sent out on 11/09/2012. Cindy S. Clelia Croft BSN, RN, CCRP 02/05/2013 1:15 PM

## 2013-02-05 NOTE — Telephone Encounter (Signed)
02/05/2013 Spoke with patient by phone today to let her know that Dr. Myna Hidalgo has received approval to continue IRESSA medication supply through the Clinical Access Program for another year. Patient states that she is down to "about one bottle", but has enough medication to last for another month. Plans made to follow-up with Dr. Myna Hidalgo regarding medication refill, which will be submitted to the specialty pharmacy for another year's worth of prescription. Patient to contact the office if she has any concerns about her IRESSA medication supply, but noted that we would work to get the next prescription in place within the next month. Cindy S. Clelia Croft BSN, RN, CCRP 02/05/2013 12:52 PM

## 2013-02-09 ENCOUNTER — Encounter: Payer: Self-pay | Admitting: *Deleted

## 2013-02-09 NOTE — Progress Notes (Signed)
Patient prescription was verified by Chyrl Civatte at specialty pharmacy on 02/08/2013. 90-day prescription with 3 refills will be submitted for processing, with patient shipment expected within 5 business days.

## 2013-02-12 ENCOUNTER — Other Ambulatory Visit (HOSPITAL_BASED_OUTPATIENT_CLINIC_OR_DEPARTMENT_OTHER): Payer: Medicare HMO | Admitting: Lab

## 2013-02-12 ENCOUNTER — Ambulatory Visit (HOSPITAL_BASED_OUTPATIENT_CLINIC_OR_DEPARTMENT_OTHER): Payer: Medicare HMO | Admitting: Hematology & Oncology

## 2013-02-12 DIAGNOSIS — C3410 Malignant neoplasm of upper lobe, unspecified bronchus or lung: Secondary | ICD-10-CM

## 2013-02-12 DIAGNOSIS — C801 Malignant (primary) neoplasm, unspecified: Secondary | ICD-10-CM

## 2013-02-12 DIAGNOSIS — E032 Hypothyroidism due to medicaments and other exogenous substances: Secondary | ICD-10-CM

## 2013-02-12 DIAGNOSIS — C770 Secondary and unspecified malignant neoplasm of lymph nodes of head, face and neck: Secondary | ICD-10-CM

## 2013-02-12 DIAGNOSIS — N318 Other neuromuscular dysfunction of bladder: Secondary | ICD-10-CM

## 2013-02-12 LAB — CBC WITH DIFFERENTIAL (CANCER CENTER ONLY)
BASO#: 0 10*3/uL (ref 0.0–0.2)
EOS%: 2.9 % (ref 0.0–7.0)
HCT: 44.6 % (ref 34.8–46.6)
HGB: 14.7 g/dL (ref 11.6–15.9)
LYMPH%: 35.4 % (ref 14.0–48.0)
MCH: 31.1 pg (ref 26.0–34.0)
MCHC: 33 g/dL (ref 32.0–36.0)
MONO%: 8 % (ref 0.0–13.0)
NEUT%: 53.4 % (ref 39.6–80.0)
RDW: 13.1 % (ref 11.1–15.7)

## 2013-02-12 LAB — COMPREHENSIVE METABOLIC PANEL
AST: 18 U/L (ref 0–37)
Alkaline Phosphatase: 38 U/L — ABNORMAL LOW (ref 39–117)
BUN: 20 mg/dL (ref 6–23)
Calcium: 9.6 mg/dL (ref 8.4–10.5)
Creatinine, Ser: 1.13 mg/dL — ABNORMAL HIGH (ref 0.50–1.10)
Total Bilirubin: 0.5 mg/dL (ref 0.3–1.2)

## 2013-02-12 NOTE — Progress Notes (Signed)
This office note has been dictated.

## 2013-02-15 NOTE — Progress Notes (Signed)
CC:   Nancy Gray. Nancy Gray, M.D.  DIAGNOSIS:  Recurrent squamous cell carcinoma.  CURRENT THERAPY:  Iressa 250 mg p.o. daily.  INTERIM HISTORY:  Nancy Gray comes in for followup.  She just does not feel all that great.  She just feels tired all time.  She is wondering if she may not be depressed.  I think she sees her neurologist later this week.  She does have memory difficulties which have bothered her.  She just not feel like doing a lot of things.  She does not hurt.  She has had a decent appetite.  She says that her taste for food has gone down a little bit.  She has not noted any change in bowel or bladder habits.  There have been some bladder issues.  She has chronic bladder hyperactivity.  She may need to see a urologist for this.  She has had no headache.  There has been no dysphagia or odynophagia.  Overall, her performance status is ECOG 2.  PHYSICAL EXAMINATION:  General:  This is an elderly white female in no obvious distress.  Vital signs:  Temperature of 97.9, pulse 63, respiratory rate 20, blood pressure 114/49.  Weight is 138.  Head and neck:  Normocephalic, atraumatic skull.  There are no ocular or oral lesions.  There are no palpable cervical or supraclavicular lymph nodes. Lungs:  Clear bilaterally.  Cardiac:  Regular rate and rhythm with a normal S1 and S2.  There are no murmurs, rubs or bruits.  Abdomen: Soft.  She has good bowel sounds.  There is no fluid wave.  There is no palpable hepatosplenomegaly.  Extremities:  Show no clubbing, cyanosis or edema.  Neurological:  Shows no focal neurological deficits.  LABORATORY STUDIES:  White cell count is 6.5, hemoglobin 14.7, hematocrit 44.6, platelet count 151.  IMPRESSION:  Nancy Gray is a nice 79 year old white female with recurrent squamous cell carcinoma.  She is on Iressa.  She received radiation therapy, which was completed back in November 2012.  Her performance status is slowly declining.  I believe this is  more related to her age and other non-oncologic issues.  We will go ahead and plan to get her back in 4 more months.  I think if everything is holding its own in 4 months, then we might be able to go every 6 months.  I think it is getting a little harder for her to get to the office.  I do not want to disrupt her life too much if we can avoid it.    ______________________________ Josph Macho, M.D. PRE/MEDQ  D:  02/12/2013  T:  02/13/2013  Job:  [id number]

## 2013-02-16 ENCOUNTER — Telehealth: Payer: Self-pay | Admitting: *Deleted

## 2013-02-16 NOTE — Telephone Encounter (Signed)
Called patient to let her know that her thyroid function is still ok per dr Myna Hidalgo

## 2013-02-16 NOTE — Telephone Encounter (Signed)
Message copied by Anselm Jungling on Fri Feb 16, 2013  3:29 PM ------      Message from: Arlan Organ R      Created: Wed Feb 14, 2013  4:31 PM       Please call let her know that her thyroid function is still okay. Thanks. Pete ------

## 2013-02-20 ENCOUNTER — Telehealth: Payer: Self-pay | Admitting: *Deleted

## 2013-02-20 NOTE — Telephone Encounter (Signed)
02/20/2013 Left message on patient's home answering machine to inquire whether she received her IRESSA prescription by mail last week. Requested that patient call me back at [phone]. Cindy S. Clelia Croft BSN, RN, CCRP 02/20/2013 10:08 AM

## 2013-02-21 NOTE — Telephone Encounter (Signed)
02/21/2013 Spoke with patient by phone today who confirms that she did receive her IRESSA prescription last week. Reminded patient that this 90-day prescription has 3 refills available, to last her through to next year. Patient thanked Charity fundraiser for calling. Cindy S. Clelia Croft BSN, RN, CCRP 02/21/2013 2:13 PM

## 2013-02-26 ENCOUNTER — Ambulatory Visit: Payer: Self-pay | Admitting: Neurology

## 2013-04-13 ENCOUNTER — Other Ambulatory Visit: Payer: Self-pay | Admitting: Physical Medicine and Rehabilitation

## 2013-04-13 ENCOUNTER — Other Ambulatory Visit (HOSPITAL_COMMUNITY): Payer: Self-pay | Admitting: Neurology

## 2013-04-13 DIAGNOSIS — Z780 Asymptomatic menopausal state: Secondary | ICD-10-CM

## 2013-04-19 ENCOUNTER — Other Ambulatory Visit: Payer: Self-pay | Admitting: Cardiology

## 2013-04-19 ENCOUNTER — Ambulatory Visit (HOSPITAL_COMMUNITY): Payer: Medicare HMO

## 2013-04-20 ENCOUNTER — Ambulatory Visit (HOSPITAL_COMMUNITY)
Admission: RE | Admit: 2013-04-20 | Discharge: 2013-04-20 | Disposition: A | Discharge status: Home / Self Care | Payer: Medicare HMO | Source: Ambulatory Visit | Attending: Neurology | Admitting: Neurology

## 2013-04-20 DIAGNOSIS — Z13820 Encounter for screening for osteoporosis: Secondary | ICD-10-CM | POA: Insufficient documentation

## 2013-04-20 DIAGNOSIS — Z780 Asymptomatic menopausal state: Secondary | ICD-10-CM | POA: Insufficient documentation

## 2013-05-23 ENCOUNTER — Telehealth: Payer: Self-pay | Admitting: Neurology

## 2013-05-23 NOTE — Telephone Encounter (Signed)
Spoke with daughter, patient has gotten worse within 2-3 weeks, short,long term memory,not eating properly,not sense of taste, finances depleted(daughter had to take control),possibly have MRI. Requesting a sooner appt

## 2013-05-24 NOTE — Telephone Encounter (Signed)
Patient reassigned with Dr Terrace Arabia, ok'd by SY, confirmed with daughter

## 2013-05-29 ENCOUNTER — Encounter: Payer: Self-pay | Admitting: Neurology

## 2013-05-29 ENCOUNTER — Ambulatory Visit: Payer: Self-pay | Admitting: Neurology

## 2013-05-29 ENCOUNTER — Ambulatory Visit (INDEPENDENT_AMBULATORY_CARE_PROVIDER_SITE_OTHER): Payer: Medicare PPO | Admitting: Neurology

## 2013-05-29 ENCOUNTER — Encounter (INDEPENDENT_AMBULATORY_CARE_PROVIDER_SITE_OTHER): Payer: Self-pay

## 2013-05-29 VITALS — BP 157/65 | HR 66 | Ht 63.0 in | Wt 137.0 lb

## 2013-05-29 DIAGNOSIS — R413 Other amnesia: Secondary | ICD-10-CM

## 2013-05-29 NOTE — Progress Notes (Signed)
[facility]  PATIENT: Nancy Gray DOB: 1929-05-06  HISTORICAL Sayla is a 79 years old right-handed Caucasian female, accompanied by her 2 daughters, for evaluation of memory loss, she was a patient of Dr. 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, she [place],  She lives in her house by herself, widowed since 1993, has been very active, was driving to church,  lives alone until October 2014,  She fell, had some compression fracture, with severe low back pain, was confined to her house ever since Oct 2014, quit driving since then, in November first 2014, she was found by her neighbor that she was tremorish,increased confusion and ambulance was called, she was found  not eating well, misplace things, repeating herself, she has missed taking her medications,, over the past few months, she also lost the sense of tastes  She is mostly homebound, and lost interest, much less active, she also suffered a right hip fracture, with right hip surgery about in 2011, ever since then she had gait difficulty using a walker  Per Dr. Imagene Gurney note, she began to have memory loss, word finding difficulties since 2006, began Aricept in July 2008, her memory loss has been fairly stable, EEG was normal in 2008, MRI of the brain with and without contrast showed mild atrophy and mild small vessel disease.  Laboratory evaluation showed a normal anatomy protein electrophoresis, ACE  level, vitamin D, fasting glucose,  TSH, RPR, sedimentation rate, vitamin B12,  Previous Mini-Mental Status Examination was 30 out of 30, clock drawing task was 4 L4, and animal naming 17,  She had a history of IV ion from Dr. Myna Hidalgo, is now on by mouth iron supplement,  She complains of lower extremity weakness, which has improved, after stop statin medications,  Since early November 2014, she has a Comptroller 24 x7 now, she has 4 children, they check on her sometimes.  REVIEW OF SYSTEMS: Full 14 system review of systems performed and notable only for depression, change in appetite, memory loss, confusion, insomnia, weight loss, shortness of breath, easy bruising,  ALLERGIES: No Known Allergies  HOME MEDICATIONS: Outpatient Prescriptions Prior to Visit  Medication Sig Dispense Refill  . albuterol-ipratropium (COMBIVENT) 18-103 MCG/ACT inhaler Inhale 2 puffs into the lungs 4 (four) times daily.        Marland Kitchen ALPRAZolam (XANAX) 0.5 MG tablet Take 0.5 mg by mouth at bedtime.        Marland Kitchen aluminum-magnesium hydroxide 200-200 MG/5ML suspension Take by mouth as needed. 2 tab; not taking within 2 hours of Iressa dose      . aspirin 81 MG tablet Take 81 mg by mouth every other day.        . B Complex Vitamins (B COMPLEX PO) Take by mouth daily.       Marland Kitchen BYSTOLIC 5 MG tablet Take 5 mg by mouth daily.      . calcium carbonate (OS-CAL) 600 MG TABS Take 600 mg by mouth 2 (two) times daily with  a meal.        . Coenzyme Q10 (COQ-10 PO) Take 100 mg by mouth daily.        . CRESTOR 5 MG tablet TAKE 1 TABLET BY MOUTH EVERY OTHER DAY  30 tablet  6  . donepezil (ARICEPT) 10 MG tablet Take 10 mg by mouth daily.        . Garlic TABS Take 1 tablet by mouth daily.        Marland Kitchen gefitinib (IRESSA) 250 MG tablet Take 250 mg by mouth daily. Started in October 2003.       Marland Kitchen MAGNESIUM CARBONATE PO Take by mouth daily.      . Multiple Vitamins-Minerals (CENTRUM PO) Take 1 tablet by mouth daily.        . Omega-3 Fatty Acids (FISH OIL PO) Take 1,000 mg by mouth daily.  Px super epa fish oil caps       . oxybutynin (OXYTROL) 3.9 MG/24HR Place 1 patch onto the skin every 3 (three) days.      Marland Kitchen PARoxetine (PAXIL) 20 MG tablet Take 30 mg by mouth daily.      . VESICARE 5 MG tablet Take 5 mg by mouth daily.      . Ibandronate Sodium (BONIVA) 2.5 MG TABS Take 2.5 mg by mouth. Every 1 month; started in 2008        No facility-administered medications prior to visit.    PAST MEDICAL HISTORY: Past Medical History  Diagnosis Date  . Lung cancer   . COPD (chronic obstructive pulmonary disease)   . MI (myocardial infarction)   . Heart attack   . History of abnormal Pap smear 2009  . History of lung cancer 2002    PAST SURGICAL HISTORY: Past Surgical History  Procedure Laterality Date  . Cardiac bypass    . Tonsillectomy and adenoidectomy    . Abdominal hysterectomy    . Coronary artey bypass      FAMILY HISTORY: Family History  Problem Relation Age of Onset  . Cancer Mother   . Myasthenia gravis Father     SOCIAL HISTORY:  History   Social History  . Marital Status: Widowed    Spouse Name: N/A    Number of Children: 4  . Years of Education: 13   Occupational History  .      retired   Social History Main Topics  . Smoking status: Former Smoker    Quit date: 06/15/1986  . Smokeless tobacco: Never Used  . Alcohol Use: 0.6 oz/week    1 Glasses of wine per week     Comment: occasionally  . Drug Use: No  . Sexual Activity: Not on file   Other Topics Concern  . Not on file   Social History Narrative   Patient lives at home alone.   Retired.   Caffeine- Two or three cups of caffeine daily.   Right handed.   Education- some college           PHYSICAL EXAM   Filed Vitals:   05/29/13 0845  BP: 157/65  Pulse: 66  Height: 5\' 3"  (1.6 m)  Weight: 137 lb (62.143 kg)   Body mass index is 24.27 kg/(m^2).   Generalized: In no acute distress  Neck: Supple, no carotid bruits   Cardiac: Regular rate rhythm  Pulmonary: Clear  to auscultation bilaterally  Musculoskeletal: No deformity  Neurological examination  Mentation: Alert oriented to time, place, history taking, and causual conversation, MMSE 21/30, she  is not oriented to year, could not spell world backwards, missed 3/3 recalls.  Cranial nerve II-XII: Pupils were equal round reactive to light extraocular movements were full, visual field were full on confrontational test. facial sensation and strength were normal. hearing was intact to finger rubbing bilaterally. Uvula tongue midline.  head turning and shoulder shrug and were normal and symmetric.Tongue protrusion into cheek strength was normal.  Motor: normal tone, bulk and strength.  Sensory: Intact to fine touch, pinprick, preserved vibratory sensation, and proprioception at toes.  Coordination: Normal finger to nose, heel-to-shin bilaterally there was no truncal ataxia  Gait: Rising up from seated position by pushing on chair arm, dragging right leg, cautious, unsteady   Deep tendon reflexes: Brachioradialis 2/2, biceps 2/2, triceps 2/2, patellar 0/0, Achilles 0/0, plantar responses were flexor bilaterally.   DIAGNOSTIC DATA (LABS, IMAGING, TESTING) - I reviewed patient records, labs, notes, testing and imaging myself where available.  Lab Results  Component Value Date   WBC 6.5 02/12/2013   HGB 14.7 02/12/2013   HCT 44.6 02/12/2013   MCV 94 02/12/2013   PLT 151 02/12/2013      Component Value Date/Time   NA 140 02/12/2013 1524   NA 142 11/25/2008 1400   K 4.2 02/12/2013 1524   K 4.6 11/25/2008 1400   CL 104 02/12/2013 1524   CL 100 11/25/2008 1400   CO2 29 02/12/2013 1524   CO2 31 11/25/2008 1400   GLUCOSE 73 02/12/2013 1524   GLUCOSE 78 11/25/2008 1400   BUN 20 02/12/2013 1524   BUN 19 11/25/2008 1400   CREATININE 1.13* 02/12/2013 1524   CREATININE 1.0 11/25/2008 1400   CALCIUM 9.6 02/12/2013 1524   CALCIUM 9.8 11/25/2008 1400   PROT 6.0 02/12/2013 1524   PROT 6.7 11/25/2008 1400   ALBUMIN 4.0 02/12/2013 1524    AST 18 02/12/2013 1524   AST 24 11/25/2008 1400   ALT 19 02/12/2013 1524   ALT 19 11/25/2008 1400   ALKPHOS 38* 02/12/2013 1524   ALKPHOS 50 11/25/2008 1400   BILITOT 0.5 02/12/2013 1524   BILITOT 0.70 11/25/2008 1400   GFRNONAA 50* 02/12/2011 1550   GFRAA >60 02/12/2011 1550   Lab Results  Component Value Date   TSH 2.092 02/12/2013     ASSESSMENT AND PLAN   79 years old right-handed Caucasian female, with past medical history of memory loss, lung cancer, presenting with acute worsening of confusion,.  1. Differentiation diagnosis including medicine side effect, dehydration, need to rule out metastatic lesion to her brain. 2  MRI of brain with and without contrast I will call her report,   3.   Keep Aricept, Namenda, 4 return to clinic in 6 months with Eber Greenhaw.         Levert Feinstein, M.D. Ph.D.  [facility] [address], [address] [place], [place] [address] [phone]

## 2013-06-05 ENCOUNTER — Ambulatory Visit
Admission: RE | Admit: 2013-06-05 | Discharge: 2013-06-05 | Disposition: A | Discharge status: Home / Self Care | Payer: Medicare HMO | Source: Ambulatory Visit | Attending: Neurology | Admitting: Neurology

## 2013-06-05 DIAGNOSIS — R413 Other amnesia: Secondary | ICD-10-CM

## 2013-06-05 MED ORDER — GADOBENATE DIMEGLUMINE 529 MG/ML IV SOLN
12.0000 mL | Freq: Once | INTRAVENOUS | Status: AC | PRN
  Administered 2013-06-05: 12 mL via INTRAVENOUS

## 2013-06-05 NOTE — Progress Notes (Signed)
Quick Note:  Please call patient, MRI brain showed age related changes. ______

## 2013-06-06 NOTE — Progress Notes (Signed)
Quick Note:  Spoke to patient's daughter and relayed MRI results, per Dr. Terrace Arabia. ______

## 2013-06-08 ENCOUNTER — Other Ambulatory Visit: Payer: Medicare HMO

## 2013-06-11 ENCOUNTER — Other Ambulatory Visit (HOSPITAL_BASED_OUTPATIENT_CLINIC_OR_DEPARTMENT_OTHER): Payer: Medicare HMO | Admitting: Lab

## 2013-06-11 ENCOUNTER — Ambulatory Visit (HOSPITAL_BASED_OUTPATIENT_CLINIC_OR_DEPARTMENT_OTHER): Payer: Medicare HMO | Admitting: Hematology & Oncology

## 2013-06-11 DIAGNOSIS — C770 Secondary and unspecified malignant neoplasm of lymph nodes of head, face and neck: Secondary | ICD-10-CM

## 2013-06-11 DIAGNOSIS — C3410 Malignant neoplasm of upper lobe, unspecified bronchus or lung: Secondary | ICD-10-CM

## 2013-06-11 DIAGNOSIS — C801 Malignant (primary) neoplasm, unspecified: Secondary | ICD-10-CM

## 2013-06-11 LAB — CMP (CANCER CENTER ONLY)
Albumin: 3.8 g/dL (ref 3.3–5.5)
CO2: 31 mEq/L (ref 18–33)
Glucose, Bld: 82 mg/dL (ref 73–118)
Potassium: 4.2 mEq/L (ref 3.3–4.7)
Sodium: 142 mEq/L (ref 128–145)
Total Bilirubin: 0.8 mg/dl (ref 0.20–1.60)
Total Protein: 6.7 g/dL (ref 6.4–8.1)

## 2013-06-11 LAB — CBC WITH DIFFERENTIAL (CANCER CENTER ONLY)
BASO#: 0 10*3/uL (ref 0.0–0.2)
Eosinophils Absolute: 0.1 10*3/uL (ref 0.0–0.5)
HCT: 44.6 % (ref 34.8–46.6)
HGB: 14.4 g/dL (ref 11.6–15.9)
MCH: 30.1 pg (ref 26.0–34.0)
MCV: 93 fL (ref 81–101)
MONO%: 6.4 % (ref 0.0–13.0)
NEUT#: 5.6 10*3/uL (ref 1.5–6.5)
RBC: 4.78 10*6/uL (ref 3.70–5.32)

## 2013-06-11 NOTE — Progress Notes (Signed)
This office note has been dictated.

## 2013-06-13 ENCOUNTER — Telehealth: Payer: Self-pay | Admitting: Nurse Practitioner

## 2013-06-13 NOTE — Telephone Encounter (Addendum)
Message copied by Glee Arvin on Wed Jun 13, 2013  5:05 PM ------      Message from: Josph Macho      Created: Tue Jun 12, 2013  3:14 PM       Please call and let her know that her thyroid function is normal. Thanks. Pete ------Pt verbalized understanding and appreciation.

## 2013-06-17 NOTE — Progress Notes (Signed)
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  CURRENT THERAPY:  Iressa 250 mg p.o. daily.  INTERIM HISTORY:  Nancy Gray comes in for her followup.  We last saw her back in August.  Since then, she has been doing okay, although her memory continues to have issues.  Two of her daughters came in to be with her.  It is more difficult for her to get around.  She now is on Aricept and Namenda.  These maybe have helped a little bit, but for the most part, she still is having some memory issues.  Health wise, physically, she seems to be doing fairly well.  She has had no problems with falling.  She did have, I think, a fall after I saw her last.  She sustained, I think, a couple compression fractures in her back.  She did not require any kind of surgery or kyphoplasty.  These seem to be improved with her healing.  She has had no problem with bowels or bladder.  She has had no cough or shortness of breath.  She has had no leg swelling.  She has had no headache.  Overall, her performance status is ECOG 2.  PHYSICAL EXAMINATION:  General:  This is an elderly white female, in no obvious distress.  Vital Signs:  Temperature of 97.9, pulse 70, respiratory rate 14, blood pressure 150/61, weight is 136 pounds.  Head and Neck Exam:  Shows a normocephalic, atraumatic skull.  There are no ocular or oral lesions.  There are no palpable cervical or supraclavicular lymph nodes.  Lungs:  Clear bilaterally.  There are no rales, wheezes or rhonchi.  Cardiac Exam:  Regular rate and rhythm with a normal S1, S2.  There are no murmurs, rubs or bruits.  Abdomen:  Soft. She has good bowel sounds.  There is no fluid wave.  There is a palpable abdominal mass.  There is no palpable hepatosplenomegaly.  Extremities: Show osteoarthritic changes in her joints.  She has 4/5 strength in her legs.  She has some decreased range motion of her joints.  Neurological Exam:  Shows  no focal neurological deficits.  Skin Exam:  Shows no rashes, ecchymosis, or petechiae.  LABORATORY STUDIES:  White cell count is 7.4, hemoglobin 14.4, hematocrit 44.6, platelet count 182.  BUN 24, creatinine 0.9.  Calcium 9.7 with an albumin of 3.8.  IMPRESSION:  Nancy Gray is a very charming 79 year old white female.  She has a history of recurrent squamous cell carcinoma.  She is on Iressa because of her history of lung cancer.  She got radiation for the squamous cell carcinoma.  This was, I think finished up back in November 2012.  Again, the memory issue is the problem for her right now.  Hopefully, Neurology can help with this.  I do not see anything that would suggest a recurrent malignancy.  We are checking her thyroid function.  We will see where her TSH is.  I want to see her back in 6 months.    ______________________________ Josph Macho, M.D. PRE/MEDQ  D:  06/11/2013  T:  06/16/2013  Job:  [id number]

## 2013-08-06 ENCOUNTER — Ambulatory Visit: Payer: Self-pay | Admitting: Neurology

## 2013-10-02 ENCOUNTER — Encounter: Payer: Self-pay | Admitting: Cardiology

## 2013-11-19 ENCOUNTER — Ambulatory Visit: Payer: Medicare HMO | Admitting: Cardiology

## 2013-11-20 ENCOUNTER — Other Ambulatory Visit: Payer: Medicare HMO

## 2013-11-23 ENCOUNTER — Ambulatory Visit: Payer: Medicare HMO | Admitting: Cardiology

## 2013-11-28 ENCOUNTER — Ambulatory Visit: Payer: Medicare HMO | Admitting: Cardiology

## 2013-11-28 ENCOUNTER — Other Ambulatory Visit: Payer: Medicare HMO

## 2013-11-28 ENCOUNTER — Ambulatory Visit (INDEPENDENT_AMBULATORY_CARE_PROVIDER_SITE_OTHER): Payer: Medicare PPO | Admitting: Nurse Practitioner

## 2013-11-28 ENCOUNTER — Encounter: Payer: Self-pay | Admitting: Nurse Practitioner

## 2013-11-28 VITALS — BP 164/64 | HR 64 | Ht 62.0 in | Wt 132.0 lb

## 2013-11-28 DIAGNOSIS — R413 Other amnesia: Secondary | ICD-10-CM

## 2013-11-28 NOTE — Patient Instructions (Signed)
Memory score is stable Continue Aricept and Namenda Followup in 6 months

## 2013-11-28 NOTE — Progress Notes (Signed)
[facility]  PATIENT: Nancy Gray DOB: [birth year]   REASON FOR VISIT: Followup for memory loss   HISTORY OF PRESENT ILLNESS: Ms. Berman, 80 year old female returns for followup. She has had memory problems since 2006 and is currently on Namenda and Aricept tolerating that without side effects. Her memory score is stable. Repeat MRI of the brain 06/05/2013 without acute or metastatic intracranial abnormality. Mild progression of chronic white matter signal changes 2008. Chronic small vessel disease. She lives in her own home, someone is with her most of the time,she no longer cooks, she no longer drives. She can perform her ADLs. Her 2 daughters oversee her finances. She returns for reevaluation    HISTORY: evaluation of memory loss, she was a patient of Dr. 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, she [place], She lives in her house by herself, widowed since 1993, has been very active, was driving to church, lives alone until October 2014,  She fell, had some compression fracture, with severe low back pain, was confined to her house ever since Oct 2014, quit driving since then, in November first 2014, she was found by her neighbor that she was tremorish,increased confusion and ambulance was called, she was found not eating well, misplace things, repeating herself, she has missed taking her medications,, over the past few months, she also lost the sense of tastes  She is mostly homebound, and  lost interest, much less active, she also suffered a right hip fracture, with right hip surgery about in 2011, ever since then she had gait difficulty using a walker  Per Dr. Tressia Danas note, she began to have memory loss, word finding difficulties since 2006, began Aricept in July 2008, her memory loss has been fairly stable, EEG was normal in 2008, MRI of the brain with and without contrast showed mild atrophy and mild small vessel disease.  Laboratory evaluation showed a normal anatomy protein electrophoresis, ACE level, vitamin D, fasting glucose, TSH, RPR, sedimentation rate, vitamin B12,  Previous Mini-Mental Status Examination was 30 out of 30, clock drawing task was 4 L4, and animal naming 17,  She had a history of IV ion from Dr. Marin Olp, is now on by mouth iron supplement, She complains of lower extremity weakness, which has improved, after stop statin medications,  Since early November 2014, she has a Actuary 24 x7 now, she has 4 children, they check on her sometimes.   REVIEW OF SYSTEMS: Full 14 system review of systems performed and notable only for those listed, all others are neg:  Constitutional: N/A  Cardiovascular: N/A  Ear/Nose/Throat: Hearing loss Skin: N/A  Eyes: N/A  Respiratory: Shortness of breath, wheezing  Gastroitestinal: Urinary frequency Hematology/Lymphatic: N/A  Endocrine: N/A Musculoskeletal:N/A  Allergy/Immunology: N/A  Neurological: Memory loss  Psychiatric: Decreased concentration  Sleep : NA   ALLERGIES: No Known Allergies  HOME MEDICATIONS: Outpatient Prescriptions Prior to Visit  Medication Sig Dispense Refill  . albuterol-ipratropium (COMBIVENT) 18-103 MCG/ACT inhaler Inhale 2 puffs into the lungs  2 (two) times daily.       Marland Kitchen ALPRAZolam (XANAX) 1 MG tablet Take 1 mg by mouth at bedtime as needed for anxiety. 0.5 to 1 mg as needed      . aspirin 81 MG tablet Take 81 mg by mouth every other day.        . B Complex Vitamins (B COMPLEX PO) Take by mouth  daily.       Marland Kitchen BYSTOLIC 5 MG tablet Take 5 mg by mouth daily.      . calcium carbonate (OS-CAL) 600 MG TABS Take 600 mg by mouth 2 (two) times daily with a meal.        . Coenzyme Q10 (COQ-10 PO) Take 100 mg by mouth daily.        . CRESTOR 5 MG tablet TAKE 1 TABLET BY MOUTH EVERY OTHER DAY  30 tablet  6  . donepezil (ARICEPT) 10 MG tablet Take 10 mg by mouth daily.        . Garlic TABS Take 1 tablet by mouth daily.        Marland Kitchen gefitinib (IRESSA) 250 MG tablet Take 250 mg by mouth daily. Started in October 2003.       . Memantine HCl (NAMENDA PO) Take 10 mg by mouth 2 (two) times daily.       . Multiple Vitamins-Minerals (CENTRUM PO) Take 1 tablet by mouth daily.        . Omega-3 Fatty Acids (FISH OIL PO) Take 1,000 mg by mouth daily. Px super epa fish oil caps       . risedronate (ACTONEL) 150 MG tablet Take 150 mg by mouth every 30 (thirty) days. with water on empty stomach, nothing by mouth or lie down for next 30 minutes.      . VESICARE 5 MG tablet Take 5 mg by mouth daily.      . diclofenac (FLECTOR) 1.3 % PTCH Place 1 patch onto the skin 2 (two) times daily.      Marland Kitchen PARoxetine (PAXIL) 20 MG tablet Take 30 mg by mouth daily.       No facility-administered medications prior to visit.    PAST MEDICAL HISTORY: Past Medical History  Diagnosis Date  . Lung cancer   . COPD (chronic obstructive pulmonary disease)   . MI (myocardial infarction)   . Heart attack   . History of abnormal Pap smear 2009  . History of lung cancer 2002    PAST SURGICAL HISTORY: Past Surgical History  Procedure Laterality Date  . Cardiac bypass    . Tonsillectomy and adenoidectomy    . Abdominal hysterectomy    . Coronary artey bypass      FAMILY HISTORY: Family History  Problem Relation Age of Onset  . Cancer Mother   . Myasthenia gravis Father     SOCIAL HISTORY: History   Social History  . Marital Status: Widowed    Spouse Name: N/A    Number of Children: 4  . Years of Education: 13    Occupational History  .      retired   Social History Main Topics  . Smoking status: Former Smoker    Quit date: 06/15/1986  . Smokeless tobacco: Never Used  . Alcohol Use: 0.6 oz/week    1 Glasses of wine per week     Comment: occasionally  . Drug Use: No  . Sexual Activity: Not on file   Other Topics Concern  . Not on file  Social History Narrative   Patient lives at home alone.   Retired.   Caffeine- Two or three cups of caffeine daily.   Right handed.   Education- some college           PHYSICAL EXAM  Filed Vitals:   11/28/13 1353  BP: 164/64  Pulse: 64  Height: 5\' 2"  (1.575 m)  Weight: 132 lb (59.875 kg)   Body mass index is 24.14 kg/(m^2).  Generalized: Well developed, in no acute distress  Head: normocephalic and atraumatic,. Oropharynx benign  Neck: Supple, no carotid bruits  Cardiac: Regular rate rhythm, no murmur  Musculoskeletal: No deformity   Neurological examination   Mentation: Alert ,MMSE 25/30 missing items in orientation, and 1/3 recall. AFT 15.  Follows all commands speech and language fluent  Cranial nerve II-XII: Pupils were equal round reactive to light extraocular movements were full, visual field were full on confrontational test. Facial sensation and strength were normal. hearing was intact to finger rubbing bilaterally. Uvula tongue midline. head turning and shoulder shrug were normal and symmetric.Tongue protrusion into cheek strength was normal. Motor: normal bulk and tone, full strength in the BUE, BLE, fine finger movements normal, no pronator drift. No focal weakness Sensory: normal and symmetric to light touch, pinprick, and  vibration  Coordination: finger-nose-finger, heel-to-shin bilaterally, no dysmetria Reflexes: Brachioradialis 2/2, biceps 2/2, triceps 2/2, patellar 0/0, Achilles 0/0, plantar responses were flexor bilaterally. Gait and Station: Rising up from seated position without assistance, wide based stance ,  cautious mildly unsteady gait , no assistive device  DIAGNOSTIC DATA (LABS, IMAGING, TESTING) - I reviewed patient records, labs, notes, testing and imaging myself where available.  Lab Results  Component Value Date   WBC 7.4 06/11/2013   HGB 14.4 06/11/2013   HCT 44.6 06/11/2013   MCV 93 06/11/2013   PLT 182 06/11/2013      Component Value Date/Time   NA 142 06/11/2013 1458   NA 140 02/12/2013 1524   K 4.2 06/11/2013 1458   K 4.2 02/12/2013 1524   CL 102 06/11/2013 1458   CL 104 02/12/2013 1524   CO2 31 06/11/2013 1458   CO2 29 02/12/2013 1524   GLUCOSE 82 06/11/2013 1458   GLUCOSE 73 02/12/2013 1524   BUN 24* 06/11/2013 1458   BUN 20 02/12/2013 1524   CREATININE 0.9 06/11/2013 1458   CREATININE 1.13* 02/12/2013 1524   CALCIUM 9.7 06/11/2013 1458   CALCIUM 9.6 02/12/2013 1524   PROT 6.7 06/11/2013 1458   PROT 6.0 02/12/2013 1524   ALBUMIN 4.0 02/12/2013 1524   AST 20 06/11/2013 1458   AST 18 02/12/2013 1524   ALT 19 06/11/2013 1458   ALT 19 02/12/2013 1524   ALKPHOS 55 06/11/2013 1458   ALKPHOS 38* 02/12/2013 1524   BILITOT 0.80 06/11/2013 1458   BILITOT 0.5 02/12/2013 1524   GFRNONAA 50* 02/12/2011 1550   GFRAA >60 02/12/2011 1550       ASSESSMENT AND PLAN  80 y.o. year old female  has a past medical history of Lung cancer; COPD (chronic obstructive pulmonary disease); MI (myocardial infarction); Heart attack; and memory loss here to followup  Memory score is stable Continue Aricept and Namenda refilled by PCP Followup in 6 months Dennie Bible, [facility], [facility], APRN  [facility] [address] [place], [place] [address] [phone]

## 2013-11-29 ENCOUNTER — Other Ambulatory Visit: Payer: Self-pay | Admitting: *Deleted

## 2013-11-29 DIAGNOSIS — E7800 Pure hypercholesterolemia, unspecified: Secondary | ICD-10-CM

## 2013-11-29 DIAGNOSIS — Z0289 Encounter for other administrative examinations: Secondary | ICD-10-CM

## 2013-11-29 DIAGNOSIS — Z79899 Other long term (current) drug therapy: Secondary | ICD-10-CM

## 2013-12-06 ENCOUNTER — Encounter: Payer: Self-pay | Admitting: Cardiology

## 2013-12-06 ENCOUNTER — Ambulatory Visit (INDEPENDENT_AMBULATORY_CARE_PROVIDER_SITE_OTHER): Payer: Medicare HMO | Admitting: Cardiology

## 2013-12-06 ENCOUNTER — Other Ambulatory Visit: Payer: Medicare HMO

## 2013-12-06 VITALS — BP 132/64 | HR 62 | Ht 62.0 in | Wt 134.0 lb

## 2013-12-06 DIAGNOSIS — I10 Essential (primary) hypertension: Secondary | ICD-10-CM

## 2013-12-06 DIAGNOSIS — R413 Other amnesia: Secondary | ICD-10-CM | POA: Insufficient documentation

## 2013-12-06 DIAGNOSIS — I252 Old myocardial infarction: Secondary | ICD-10-CM

## 2013-12-06 DIAGNOSIS — I2510 Atherosclerotic heart disease of native coronary artery without angina pectoris: Secondary | ICD-10-CM

## 2013-12-06 DIAGNOSIS — E7800 Pure hypercholesterolemia, unspecified: Secondary | ICD-10-CM | POA: Insufficient documentation

## 2013-12-06 DIAGNOSIS — I219 Acute myocardial infarction, unspecified: Secondary | ICD-10-CM

## 2013-12-06 DIAGNOSIS — Z79899 Other long term (current) drug therapy: Secondary | ICD-10-CM | POA: Insufficient documentation

## 2013-12-06 LAB — HEPATIC FUNCTION PANEL
ALBUMIN: 3.7 g/dL (ref 3.5–5.2)
ALT: 19 U/L (ref 0–35)
AST: 23 U/L (ref 0–37)
Alkaline Phosphatase: 44 U/L (ref 39–117)
Bilirubin, Direct: 0 mg/dL (ref 0.0–0.3)
TOTAL PROTEIN: 6.1 g/dL (ref 6.0–8.3)
Total Bilirubin: 0.6 mg/dL (ref 0.2–1.2)

## 2013-12-06 LAB — LIPID PANEL
CHOL/HDL RATIO: 2
Cholesterol: 133 mg/dL (ref 0–200)
HDL: 64.7 mg/dL (ref >39.00)
LDL Cholesterol: 55 mg/dL (ref 0–99)
Triglycerides: 66 mg/dL (ref 0.0–149.0)
VLDL: 13.2 mg/dL (ref 0.0–40.0)

## 2013-12-06 NOTE — Patient Instructions (Signed)
Your physician recommends that you continue on your current medications as directed. Please refer to the Current Medication list given to you today.  Your physician wants you to follow-up in: 1 year with Dr. Skains. You will receive a reminder letter in the mail two months in advance. If you don't receive a letter, please call our office to schedule the follow-up appointment.  

## 2013-12-06 NOTE — Progress Notes (Signed)
[place]. [address], [place]  [address] Phone: [phone] Fax:  [phone]  Date:  12/06/2013   ID:  Nancy Gray, DOB 1928/12/07, MRN [id number]  PCP:  Woody Seller, MD   History of Present Illness: Nancy Gray is a 80 y.o. female former patient of Dr. Leonia Reeves with severe COPD, hyperlipidemia, recurrent non-small cell lung carcinoma (2012), coronary artery bypass in January of 1997 4-vessel (LIMA to LAD, SVG to diagonal, SVG to obtuse marginal, SVG to posterior ascending artery), here for annual followup.  She takes Crestor every other day 5mg . Labs pending today.  Mild SOB with COPD (takes inhailers). No CP.  In regards to coronary artery disease, no exertional angina, doing well. She was very active in her [facility]. Dr. Marin Olp once again in remission of cancer.  Normal ejection fraction. Cardiac MRI 2008. Inferior scar. Nuclear stress test 2008, no ischemia, inferior scar.  She recently sustained a fall. Fractured vertebra. Using walker. Memory impairment has become more apparent. 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  Wt Readings from Last 3 Encounters:  12/06/13 134 lb (60.782 kg)  11/28/13 132 lb (59.875 kg)  06/11/13 136 lb (61.689 kg)     Past Medical History  Diagnosis Date  . Lung cancer   . COPD (chronic obstructive pulmonary disease)   . MI (myocardial infarction)   . Heart attack   . History of abnormal Pap smear 2009  . History of lung cancer 2002    Past Surgical History  Procedure Laterality Date  . Cardiac bypass    . Tonsillectomy and adenoidectomy    . Abdominal hysterectomy    . Coronary artey bypass      Current Outpatient Prescriptions  Medication Sig Dispense Refill  . albuterol-ipratropium (COMBIVENT) 18-103 MCG/ACT inhaler Inhale 2 puffs into the lungs 2 (two) times daily.       Marland Kitchen ALPRAZolam (XANAX) 1 MG tablet Take 1 mg by mouth at bedtime as needed for anxiety. 0.5 to 1 mg as needed      . aspirin 81 MG  tablet Take 81 mg by mouth every other day.        . B Complex Vitamins (B COMPLEX PO) Take by mouth daily.       Marland Kitchen BYSTOLIC 5 MG tablet Take 5 mg by mouth daily.      . calcium carbonate (OS-CAL) 600 MG TABS Take 600 mg by mouth 2 (two) times daily with a meal.        . citalopram (CELEXA) 20 MG tablet       . Coenzyme Q10 (COQ-10 PO) Take 100 mg by mouth daily.        . CRESTOR 5 MG tablet TAKE 1 TABLET BY MOUTH EVERY OTHER DAY  30 tablet  6  . donepezil (ARICEPT) 10 MG tablet Take 10 mg by mouth daily.        . Garlic TABS Take 1 tablet by mouth daily.        Marland Kitchen gefitinib (IRESSA) 250 MG tablet Take 250 mg by mouth daily. Started in October 2003.       . Loperamide HCl (IMODIUM PO) Take by mouth as needed.      . Memantine HCl (NAMENDA PO) Take 10 mg by mouth 2 (two) times daily.       . Multiple Vitamins-Minerals (CENTRUM PO) Take 1 tablet by mouth daily.        . Naproxen Sodium (  ALEVE) 220 MG CAPS Take by mouth as needed.      . Omega-3 Fatty Acids (FISH OIL PO) Take 1,000 mg by mouth daily. Px super epa fish oil caps       . risedronate (ACTONEL) 150 MG tablet Take 150 mg by mouth every 30 (thirty) days. with water on empty stomach, nothing by mouth or lie down for next 30 minutes.      . VENTOLIN HFA 108 (90 BASE) MCG/ACT inhaler       . VESICARE 5 MG tablet Take 5 mg by mouth daily.       No current facility-administered medications for this visit.    Allergies:   No Known Allergies  Social History:  The patient  reports that she quit smoking about 27 years ago. She has never used smokeless tobacco. She reports that she drinks about .6 ounces of alcohol per week. She reports that she does not use illicit drugs.   Family History  Problem Relation Age of Onset  . Cancer Mother   . Myasthenia gravis Father     ROS:  Please see the history of present illness.   Denies any chest pain, syncope, orthopnea. She did sustain a fall, fractured vertebra. Ecchymosis on hands.   All other  systems reviewed and negative.   PHYSICAL EXAM: VS:  BP 132/64  Pulse 62  Ht 5\' 2"  (1.575 m)  Wt 134 lb (60.782 kg)  BMI 24.50 kg/m2 Well nourished, well developed, in no acute distressElderly HEENT: normal, [place]/AT, EOMI Neck: no JVD, normal carotid upstroke, no bruit Cardiac:  normal S1, S2; RRR; no murmur Lungs:  clear to auscultation bilaterally, no wheezing, rhonchi or rales Abd: soft, nontender, no hepatomegaly, no bruits Ext: no edema, 2+ distal pulses, good dorsalis pedis Skin: warm and dry, ecchymosis noted on hands bilaterally GU: deferred Neuro: no focal abnormalities noted, AAO x 3  EKG:  12/06/13-sinus rhythm, 62 with no other abnormalities  ASSESSMENT AND PLAN:  1. Coronary artery disease-bypass surgery in 1997, overall doing well, no anginal symptoms. She has not used nitroglycerin in years. Continue with current secondary prevention. Low-dose statin therapy. 2. Hyperlipidemia-Crestor approximately 2-3 times per week. Protection. 3. Hypertension-currently well controlled. Medications reviewed. 4. History of lung cancer-currently in remission. 5. Memory impairment-medications reviewed, discussed with family.  Signed, Candee Furbish, MD [facility]  12/06/2013 11:44 AM

## 2013-12-07 ENCOUNTER — Telehealth: Payer: Self-pay | Admitting: Hematology & Oncology

## 2013-12-07 NOTE — Telephone Encounter (Signed)
Pt moved 6-3 to 6-29 I offered her other days but would only take Monday afternoons

## 2013-12-12 ENCOUNTER — Other Ambulatory Visit: Payer: Medicare HMO | Admitting: Lab

## 2013-12-12 ENCOUNTER — Ambulatory Visit: Payer: Medicare HMO | Admitting: Hematology & Oncology

## 2013-12-14 ENCOUNTER — Encounter: Payer: Self-pay | Admitting: *Deleted

## 2014-01-07 ENCOUNTER — Other Ambulatory Visit (HOSPITAL_BASED_OUTPATIENT_CLINIC_OR_DEPARTMENT_OTHER): Payer: Medicare HMO | Admitting: Lab

## 2014-01-07 ENCOUNTER — Encounter: Payer: Self-pay | Admitting: Hematology & Oncology

## 2014-01-07 ENCOUNTER — Ambulatory Visit (HOSPITAL_BASED_OUTPATIENT_CLINIC_OR_DEPARTMENT_OTHER): Payer: Medicare HMO | Admitting: Hematology & Oncology

## 2014-01-07 VITALS — BP 137/51 | HR 57 | Temp 97.9°F | Resp 14 | Ht 62.0 in | Wt 125.0 lb

## 2014-01-07 DIAGNOSIS — Z85118 Personal history of other malignant neoplasm of bronchus and lung: Secondary | ICD-10-CM

## 2014-01-07 DIAGNOSIS — C3410 Malignant neoplasm of upper lobe, unspecified bronchus or lung: Secondary | ICD-10-CM

## 2014-01-07 LAB — CMP (CANCER CENTER ONLY)
ALK PHOS: 43 U/L (ref 26–84)
ALT(SGPT): 24 U/L (ref 10–47)
AST: 24 U/L (ref 11–38)
Albumin: 3.4 g/dL (ref 3.3–5.5)
BUN: 19 mg/dL (ref 7–22)
CO2: 27 mEq/L (ref 18–33)
Calcium: 9.3 mg/dL (ref 8.0–10.3)
Chloride: 100 mEq/L (ref 98–108)
Creat: 1.2 mg/dl (ref 0.6–1.2)
Glucose, Bld: 75 mg/dL (ref 73–118)
Potassium: 4.3 mEq/L (ref 3.3–4.7)
SODIUM: 141 meq/L (ref 128–145)
Total Bilirubin: 0.6 mg/dl (ref 0.20–1.60)
Total Protein: 6.4 g/dL (ref 6.4–8.1)

## 2014-01-07 LAB — CBC WITH DIFFERENTIAL (CANCER CENTER ONLY)
BASO#: 0 10*3/uL (ref 0.0–0.2)
BASO%: 0.1 % (ref 0.0–2.0)
EOS%: 1.6 % (ref 0.0–7.0)
Eosinophils Absolute: 0.1 10*3/uL (ref 0.0–0.5)
HCT: 42.9 % (ref 34.8–46.6)
HGB: 14.2 g/dL (ref 11.6–15.9)
LYMPH#: 1.3 10*3/uL (ref 0.9–3.3)
LYMPH%: 16.4 % (ref 14.0–48.0)
MCH: 30.5 pg (ref 26.0–34.0)
MCHC: 33.1 g/dL (ref 32.0–36.0)
MCV: 92 fL (ref 81–101)
MONO#: 0.6 10*3/uL (ref 0.1–0.9)
MONO%: 8.1 % (ref 0.0–13.0)
NEUT%: 73.8 % (ref 39.6–80.0)
NEUTROS ABS: 5.6 10*3/uL (ref 1.5–6.5)
Platelets: 159 10*3/uL (ref 145–400)
RBC: 4.66 10*6/uL (ref 3.70–5.32)
RDW: 13.7 % (ref 11.1–15.7)
WBC: 7.6 10*3/uL (ref 3.9–10.0)

## 2014-01-07 NOTE — Progress Notes (Signed)
Hematology and Oncology Follow Up Visit  Nancy Gray [id number] Mar 27, 1929 80 y.o. 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  Current Therapy:   Iressa 250 mg p.o. daily.     Interim History:  Ms.  Nancy Gray is back for followup. She is doing quite well. 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  She does have a walker with her. This does help keep her more stable. She has not fallen. She does have some memory issues. This seems to be about the same.  She's had no cough or shortness of breath. She's had no pain. There is no change in bowel or bladder habits. She's had no nausea vomiting. She's had no leg swelling. She has a couple areas of ecchymoses. This is chronic.  There's been no fever.  She's had no bleeding.  Overall, her performance status ECOG 2-3.  Medications: Current outpatient prescriptions:albuterol-ipratropium (COMBIVENT) 18-103 MCG/ACT inhaler, Inhale 2 puffs into the lungs every 6 (six) hours as needed. , Disp: , Rfl: ;  ALPRAZolam (XANAX) 1 MG tablet, Take 1 mg by mouth at bedtime as needed for anxiety. 0.5 to 1 mg as needed, Disp: , Rfl: ;  aspirin 81 MG tablet, Take 81 mg by mouth every other day.  , Disp: , Rfl: ;  B Complex Vitamins (B COMPLEX PO), Take by mouth daily. , Disp: , Rfl:  BYSTOLIC 5 MG tablet, Take 5 mg by mouth daily., Disp: , Rfl: ;  calcium carbonate (OS-CAL) 600 MG TABS, Take 600 mg by mouth 2 (two) times daily with a meal.  , Disp: , Rfl: ;  citalopram (CELEXA) 20 MG tablet, Take 20 mg by mouth daily. , Disp: , Rfl: ;  Coenzyme Q10 (COQ-10 PO), Take 100 mg by mouth daily.  , Disp: , Rfl: ;  CRESTOR 5 MG tablet, TAKE 1 TABLET BY MOUTH EVERY OTHER DAY, Disp: 30 tablet, Rfl: 6 donepezil (ARICEPT) 10 MG tablet, Take 10 mg by mouth daily. , Disp: , Rfl: ;  Garlic TABS, Take 1 tablet by mouth daily.  , Disp: , Rfl: ;  gefitinib  (IRESSA) 250 MG tablet, Take 250 mg by mouth daily. Started in October 2003. , Disp: , Rfl: ;  Loperamide HCl (IMODIUM PO), Take by mouth as needed., Disp: , Rfl: ;  Memantine HCl (NAMENDA PO), Take 10 mg by mouth 2 (two) times daily. , Disp: , Rfl:  Multiple Vitamins-Minerals (CENTRUM PO), Take 1 tablet by mouth daily.  , Disp: , Rfl: ;  Naproxen Sodium (ALEVE) 220 MG CAPS, Take by mouth as needed., Disp: , Rfl: ;  Omega-3 Fatty Acids (FISH OIL PO), Take 1,000 mg by mouth daily. Px super epa fish oil caps , Disp: , Rfl:  risedronate (ACTONEL) 150 MG tablet, Take 150 mg by mouth every 30 (thirty) days. with water on empty stomach, nothing by mouth or lie down for next 30 minutes., Disp: , Rfl: ;  VENTOLIN HFA 108 (90 BASE) MCG/ACT inhaler, Inhale 2 puffs into the lungs every 4 (four) hours as needed. , Disp: , Rfl: ;  VESICARE 5 MG tablet, Take 5 mg by mouth daily., Disp: , Rfl:   Allergies: No Known Allergies  Past Medical History, Surgical history, Social history, and Family History were reviewed and updated.  Review of Systems: As above  Physical Exam:  height is 5\' 2"  (1.575  m) and weight is 125 lb (56.7 kg). Her oral temperature is 97.9 F (36.6 C). Her blood pressure is 137/51 and her pulse is 57. Her respiration is 14.   Totally somewhat petite white female. Lungs are clear. Head and neck exam shows no ocular or oral lesions. She has no adenopathy in the neck. Lungs are clear. Cardiac exam regular in rhythm with a normal S1 and S2. She has no murmurs rubs or bruits. Abdomen is soft. Has good bowel sounds. There is no fluid wave. There is no palpable liver or spleen tip. Back exam shows some slight kyphosis. No tenderness is noted over the spine ribs or hips. Extremities shows no clubbing cyanosis or edema. She is going to show the joints. Skin exam no rashes. She is a to z ecchymoses. Neurological exam is non-focal.  Lab Results  Component Value Date   WBC 7.6 01/07/2014   HGB 14.2  01/07/2014   HCT 42.9 01/07/2014   MCV 92 01/07/2014   PLT 159 01/07/2014     Chemistry      Component Value Date/Time   NA 141 01/07/2014 1525   NA 140 02/12/2013 1524   K 4.3 01/07/2014 1525   K 4.2 02/12/2013 1524   CL 100 01/07/2014 1525   CL 104 02/12/2013 1524   CO2 27 01/07/2014 1525   CO2 29 02/12/2013 1524   BUN 19 01/07/2014 1525   BUN 20 02/12/2013 1524   CREATININE 1.2 01/07/2014 1525   CREATININE 1.13* 02/12/2013 1524      Component Value Date/Time   CALCIUM 9.3 01/07/2014 1525   CALCIUM 9.6 02/12/2013 1524   ALKPHOS 43 01/07/2014 1525   ALKPHOS 44 12/06/2013 1127   AST 24 01/07/2014 1525   AST 23 12/06/2013 1127   ALT 24 01/07/2014 1525   ALT 19 12/06/2013 1127   BILITOT 0.60 01/07/2014 1525   BILITOT 0.6 12/06/2013 1127         Impression and Plan: Ms. 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 She had radiation for this. She finishes back in November of 2012.  Am still not sure if this was a recurrent squamous cell cancer or this was a separate primary. Regardless, this is not a problem for her.  We will go ahead and plan to get her back in 6 more months. I don't see that we need any scans on her.   [name], MD 6/29/20156:27 PM

## 2014-01-08 ENCOUNTER — Telehealth: Payer: Self-pay | Admitting: *Deleted

## 2014-01-08 ENCOUNTER — Other Ambulatory Visit: Payer: Self-pay | Admitting: *Deleted

## 2014-01-08 DIAGNOSIS — C3410 Malignant neoplasm of upper lobe, unspecified bronchus or lung: Secondary | ICD-10-CM

## 2014-01-08 LAB — TSH CHCC: TSH: 1.459 m[IU]/L (ref 0.308–3.960)

## 2014-01-08 MED ORDER — GEFITINIB 250 MG PO TABS: 250.0000 mg | ORAL_TABLET | Freq: Every day | ORAL | Status: DC

## 2014-01-08 NOTE — Telephone Encounter (Addendum)
Message copied by Orlando Penner on Tue Jan 08, 2014  2:56 PM ------      Message from: [name]      Created: Tue Jan 08, 2014  1:46 PM       Call - thyroid is ok.  pete ------Pt called and told of this message.  Voiced understanding.

## 2014-01-09 ENCOUNTER — Telehealth: Payer: Self-pay | Admitting: Hematology & Oncology

## 2014-01-09 NOTE — Telephone Encounter (Signed)
Pt's daughter Corky Downs. Sanders) in ofc today to give POA forms to get release of medical records. Almyra Free also signed authorization release of information forms.   POA  and AUTHORIZATION FOR RELEASE/REQUEST OF MEDICAL INFORMATION forms scanned in documents and sent to Environmental health practitioner.       COPY SCANNED

## 2014-01-16 ENCOUNTER — Telehealth: Payer: Self-pay | Admitting: *Deleted

## 2014-01-16 NOTE — Telephone Encounter (Signed)
01/16/2014 Left message for patient's daughter, Almyra Free, to give me a call regarding renewal of the patient's prescription for IRESSA, as supplied through the [facility]. Requested that Almyra Free call me back at [phone]. Cindy S. Brigitte Pulse BSN, RN, CCRP 01/16/2014 1:48 PM

## 2014-01-17 ENCOUNTER — Encounter: Payer: Self-pay | Admitting: *Deleted

## 2014-01-17 NOTE — Progress Notes (Signed)
01/17/2014 Request for Shipment of Study Drug faxed to [facility], for annual re-supply of IRESSA. Cindy S. Brigitte Pulse BSN, RN, [place] 01/17/2014 12:44 PM

## 2014-01-17 NOTE — Telephone Encounter (Signed)
01/17/2014 Spoke with patient's daughter, Dorian Pod, by phone today to confirm the details for the [facility] prescription. Per Dorian Pod, medication should be shipped to the patient's new address of [place], as all mail is being directed to Julie's house at this time. Dorian Pod believes there are about two months worth of pills remaining. Will request refill to occur in two weeks, to continue supply without interruption. Cindy S. Brigitte Pulse BSN, RN, [place] 01/17/2014 10:51 AM

## 2014-01-21 ENCOUNTER — Telehealth: Payer: Self-pay | Admitting: *Deleted

## 2014-01-21 NOTE — Telephone Encounter (Signed)
See 01/17/2014 documentation note regarding IRESSA refills.

## 2014-01-21 NOTE — Progress Notes (Signed)
01/21/2014 Voice mail message left by Mechele Claude [phone]) at [place] ([facility]) on Friday 01/18/14 at 2:47pm, requesting verbal validation of patient's prescription. Returned call this morning and spoke with Caryl Pina at [facility]. Prescription was validated, including patient's address and phone number, noting that patient has new house number, on the same street as previous prescription requests. Caryl Pina confirmed the details and will submit prescription for processing. Per Caryl Pina, it is possible that the last supply was shipped in September 2014, however, since patients may request supplies at 2 months, it is possible additional medication had been shipped previously. Per Caryl Pina, shipments are delivered via [employer]. Cindy S. Brigitte Pulse BSN, RN, CCRP 01/21/2014 9:34 AM

## 2014-01-30 ENCOUNTER — Encounter: Payer: Self-pay | Admitting: Cardiology

## 2014-06-03 ENCOUNTER — Ambulatory Visit: Payer: Medicare PPO | Admitting: Nurse Practitioner

## 2014-06-03 ENCOUNTER — Telehealth: Payer: Self-pay | Admitting: Nurse Practitioner

## 2014-06-03 ENCOUNTER — Encounter: Payer: Self-pay | Admitting: *Deleted

## 2014-06-03 NOTE — Telephone Encounter (Signed)
No show for scheduled appt 

## 2014-06-11 ENCOUNTER — Encounter: Payer: Self-pay | Admitting: Nurse Practitioner

## 2014-06-28 ENCOUNTER — Telehealth: Payer: Self-pay | Admitting: Hematology & Oncology

## 2014-06-28 NOTE — Telephone Encounter (Signed)
Pt aware moved 12-28 to 1-13, she has to have tues or wens afternoon.

## 2014-07-08 ENCOUNTER — Ambulatory Visit: Payer: Medicare HMO | Admitting: Hematology & Oncology

## 2014-07-08 ENCOUNTER — Other Ambulatory Visit: Payer: Medicare HMO | Admitting: Lab

## 2014-07-11 ENCOUNTER — Ambulatory Visit: Payer: Medicare HMO | Admitting: Hematology & Oncology

## 2014-07-11 ENCOUNTER — Other Ambulatory Visit: Payer: Medicare HMO | Admitting: Lab

## 2014-07-24 ENCOUNTER — Ambulatory Visit (HOSPITAL_BASED_OUTPATIENT_CLINIC_OR_DEPARTMENT_OTHER): Payer: Medicare HMO | Admitting: Family

## 2014-07-24 ENCOUNTER — Other Ambulatory Visit (HOSPITAL_BASED_OUTPATIENT_CLINIC_OR_DEPARTMENT_OTHER): Payer: Medicare HMO | Admitting: Lab

## 2014-07-24 ENCOUNTER — Encounter: Payer: Self-pay | Admitting: Family

## 2014-07-24 DIAGNOSIS — C770 Secondary and unspecified malignant neoplasm of lymph nodes of head, face and neck: Secondary | ICD-10-CM

## 2014-07-24 DIAGNOSIS — C801 Malignant (primary) neoplasm, unspecified: Secondary | ICD-10-CM

## 2014-07-24 DIAGNOSIS — C3492 Malignant neoplasm of unspecified part of left bronchus or lung: Secondary | ICD-10-CM

## 2014-07-24 DIAGNOSIS — C3410 Malignant neoplasm of upper lobe, unspecified bronchus or lung: Secondary | ICD-10-CM

## 2014-07-24 LAB — CBC WITH DIFFERENTIAL (CANCER CENTER ONLY)
BASO#: 0 10*3/uL (ref 0.0–0.2)
BASO%: 0.2 % (ref 0.0–2.0)
EOS%: 2.6 % (ref 0.0–7.0)
Eosinophils Absolute: 0.1 10*3/uL (ref 0.0–0.5)
HCT: 44 % (ref 34.8–46.6)
HGB: 14.4 g/dL (ref 11.6–15.9)
LYMPH#: 1.5 10*3/uL (ref 0.9–3.3)
LYMPH%: 27.4 % (ref 14.0–48.0)
MCH: 29.9 pg (ref 26.0–34.0)
MCHC: 32.7 g/dL (ref 32.0–36.0)
MCV: 91 fL (ref 81–101)
MONO#: 0.4 10*3/uL (ref 0.1–0.9)
MONO%: 6.6 % (ref 0.0–13.0)
NEUT%: 63.2 % (ref 39.6–80.0)
NEUTROS ABS: 3.5 10*3/uL (ref 1.5–6.5)
PLATELETS: 181 10*3/uL (ref 145–400)
RBC: 4.82 10*6/uL (ref 3.70–5.32)
RDW: 13.6 % (ref 11.1–15.7)
WBC: 5.5 10*3/uL (ref 3.9–10.0)

## 2014-07-24 LAB — COMPREHENSIVE METABOLIC PANEL
ALT: 20 U/L (ref 0–35)
AST: 23 U/L (ref 0–37)
Albumin: 3.8 g/dL (ref 3.5–5.2)
Alkaline Phosphatase: 39 U/L (ref 39–117)
BILIRUBIN TOTAL: 0.6 mg/dL (ref 0.2–1.2)
BUN: 20 mg/dL (ref 6–23)
CALCIUM: 9.8 mg/dL (ref 8.4–10.5)
CHLORIDE: 105 meq/L (ref 96–112)
CO2: 26 meq/L (ref 19–32)
CREATININE: 1.17 mg/dL — AB (ref 0.50–1.10)
Glucose, Bld: 85 mg/dL (ref 70–99)
Potassium: 4 mEq/L (ref 3.5–5.3)
Sodium: 139 mEq/L (ref 135–145)
Total Protein: 6.4 g/dL (ref 6.0–8.3)

## 2014-07-24 NOTE — Progress Notes (Signed)
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 She is doing well and getting around with her walker. She has had no problems since we saw her last June.  She still has issues with her memory. It seems to be a little worse this month so she is going to visit her PCP with her daughters next week and talk about it with him. She has not fallen.  She denies fever, chills, n/v, cough, rash, headache, dizziness, chest pain, palpitations, abdominal pain, constipation, diarrhea, blood in urine or stool. She has some SOB with exertion but this is not worse. No bleeding or pain.  No swelling, tenderness, numbness or tingling in her extremities.  Her appetite is good and she is staying hydrated.  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  CURRENT TREATMENT: Iressa 250 mg p.o. Daily  REVIEW OF SYSTEMS: All other 10 point review of systems is negative.   PAST MEDICAL HISTORY: Past Medical History  Diagnosis Date  . COPD (chronic obstructive pulmonary disease)     COPD, recurrent bronchitis, asthma  . History of abnormal Pap smear 2009  . History of lung cancer 2002  . CAD (coronary artery disease)   . Lung cancer   . Diverticulosis of sigmoid colon 05/06/2005  . MI (myocardial infarction) 1997  . Personal history of antineoplastic chemotherapy 6/5-7/19/2002    Gemzar/Navelbine weekly x3, every 28 days, for 2 cycles  . Personal history of radiation therapy     right lung 04/2001; left neck 04/2011  . GERD (gastroesophageal reflux disease)     diagnosed prior to 2002  . S/P PTCA (percutaneous transluminal  coronary angioplasty)     with stent placement 04/10/96 and 04/12/96  . Irritable bowel syndrome (IBS) 2010  . Osteoporosis     prior to 2003  . Memory loss 2006    PAST SURGICAL HISTORY: Past Surgical History  Procedure Laterality Date  . Tonsillectomy and adenoidectomy    . Abdominal hysterectomy  1980  . Coronary artery bypass graft  07/1995    x4  . Bronchoscopy  01/09/1999    transbronchial biopsy showed normal lung tissue with no tumor  . Closed reduction forearm fracture Left 03/21/2007  . Orif femur fracture Right 08/27/2009    FAMILY HISTORY Family History  Problem Relation Age of Onset  . Cancer Mother   . Myasthenia gravis Father     GYNECOLOGIC HISTORY:  No LMP recorded. Patient has had a hysterectomy.   SOCIAL HISTORY: History   Social History  . Marital Status: Widowed    Spouse Name: N/A    Number of Children: 4  . Years of Education: 13   Occupational History  .      retired   Social History Main Topics  . Smoking status: Former Smoker -- 1.50 packs/day for 30 years    Types: Cigarettes    Start date: 08/10/1955    Quit date: 06/15/1986  . Smokeless tobacco: Never Used     Comment: quit 27 years ago  . Alcohol Use: 0.6 oz/week    1 Glasses of wine per week     Comment: occasionally  .  Drug Use: No  . Sexual Activity: Not on file   Other Topics Concern  . Not on file   Social History Narrative   Patient lives at home alone. Retired. Caffeine- Two or three cups of caffeine daily. Right handed. Education- some college      ADVANCED DIRECTIVES:  <no information>  HEALTH MAINTENANCE: History  Substance Use Topics  . Smoking status: Former Smoker -- 1.50 packs/day for 30 years    Types: Cigarettes    Start date: 08/10/1955    Quit date: 06/15/1986  . Smokeless tobacco: Never Used     Comment: quit 27 years ago  . Alcohol Use: 0.6 oz/week    1 Glasses of wine per week     Comment: occasionally   Colonoscopy: PAP: Bone  density: Lipid panel:  No Known Allergies  Current Outpatient Prescriptions  Medication Sig Dispense Refill  . albuterol-ipratropium (COMBIVENT) 18-103 MCG/ACT inhaler Inhale 2 puffs into the lungs every 6 (six) hours as needed.     . ALPRAZolam (XANAX) 1 MG tablet Take 1 mg by mouth at bedtime as needed for anxiety. 0.5 to 1 mg as needed    . aspirin 81 MG tablet Take 81 mg by mouth every other day.      . B Complex Vitamins (B COMPLEX PO) Take by mouth daily.     Marland Kitchen BYSTOLIC 5 MG tablet Take 5 mg by mouth daily.    . calcium carbonate (OS-CAL) 600 MG TABS Take 600 mg by mouth 2 (two) times daily with a meal.      . citalopram (CELEXA) 20 MG tablet Take 20 mg by mouth daily.     . Coenzyme Q10 (COQ-10 PO) Take 100 mg by mouth daily.      . COMBIVENT RESPIMAT 20-100 MCG/ACT AERS respimat Inhale 2 puffs into the lungs 2 (two) times daily.  5  . CRESTOR 5 MG tablet TAKE 1 TABLET BY MOUTH EVERY OTHER DAY 30 tablet 6  . donepezil (ARICEPT) 10 MG tablet Take 10 mg by mouth daily.     . Garlic TABS Take 1 tablet by mouth daily.      Marland Kitchen gefitinib (IRESSA) 250 MG tablet Take 1 tablet (250 mg total) by mouth daily. 30 tablet 6  . ibandronate (BONIVA) 150 MG tablet   5  . Loperamide HCl (IMODIUM PO) Take by mouth as needed.    . memantine (NAMENDA) 10 MG tablet Take 10 mg by mouth 2 (two) times daily.  5  . Memantine HCl (NAMENDA PO) Take 10 mg by mouth 2 (two) times daily.     . Multiple Vitamins-Minerals (CENTRUM PO) Take 1 tablet by mouth daily.      . Naproxen Sodium (ALEVE) 220 MG CAPS Take by mouth as needed.    . Omega-3 Fatty Acids (FISH OIL PO) Take 1,000 mg by mouth daily. Px super epa fish oil caps     . risedronate (ACTONEL) 150 MG tablet Take 150 mg by mouth every 30 (thirty) days. with water on empty stomach, nothing by mouth or lie down for next 30 minutes.    . VENTOLIN HFA 108 (90 BASE) MCG/ACT inhaler Inhale 2 puffs into the lungs every 4 (four) hours as needed.     . VESICARE 5 MG  tablet Take 5 mg by mouth daily.     No current facility-administered medications for this visit.    OBJECTIVE: There were no vitals filed for this visit. There were no vitals filed for  this visit. ECOG FS:0 - Asymptomatic Ocular: Sclerae unicteric, pupils equal, round and reactive to light Ear-nose-throat: Oropharynx clear, dentition fair Lymphatic: No cervical or supraclavicular adenopathy Lungs no rales or rhonchi, good excursion bilaterally Heart regular rate and rhythm, no murmur appreciated Abd soft, nontender, positive bowel sounds MSK no focal spinal tenderness, no joint edema Neuro: non-focal, well-oriented, appropriate affect Breasts: Deferred  LAB RESULTS: CMP     Component Value Date/Time   NA 141 01/07/2014 1525   NA 140 02/12/2013 1524   K 4.3 01/07/2014 1525   K 4.2 02/12/2013 1524   CL 100 01/07/2014 1525   CL 104 02/12/2013 1524   CO2 27 01/07/2014 1525   CO2 29 02/12/2013 1524   GLUCOSE 75 01/07/2014 1525   GLUCOSE 73 02/12/2013 1524   BUN 19 01/07/2014 1525   BUN 20 02/12/2013 1524   CREATININE 1.2 01/07/2014 1525   CREATININE 1.13* 02/12/2013 1524   CALCIUM 9.3 01/07/2014 1525   CALCIUM 9.6 02/12/2013 1524   PROT 6.4 01/07/2014 1525   PROT 6.1 12/06/2013 1127   ALBUMIN 3.7 12/06/2013 1127   AST 24 01/07/2014 1525   AST 23 12/06/2013 1127   ALT 24 01/07/2014 1525   ALT 19 12/06/2013 1127   ALKPHOS 43 01/07/2014 1525   ALKPHOS 44 12/06/2013 1127   BILITOT 0.60 01/07/2014 1525   BILITOT 0.6 12/06/2013 1127   GFRNONAA 50* 02/12/2011 1550   GFRAA >60 02/12/2011 1550   INo results found for: SPEP, UPEP Lab Results  Component Value Date   WBC 5.5 07/24/2014   NEUTROABS 3.5 07/24/2014   HGB 14.4 07/24/2014   HCT 44.0 07/24/2014   MCV 91 07/24/2014   PLT 181 07/24/2014   No results found for: LABCA2 No components found for: LABCA125 No results for input(s): INR in the last 168 hours.  STUDIES: No results found.  ASSESSMENT/PLAN: Ms. 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 She finished radiation for this in November of 2012. She is asymptomatic at this time.  Her CBC today was normal.  She will continue on her daily Iressa.  We will see her back in 6 months for labs and follow-up.  She knows to call here with any questions or concerns and to go to the ED in the event of an emergency. We can certainly see her sooner if need be.   Eliezer Bottom, NP 07/24/2014 4:07 PM

## 2014-08-20 ENCOUNTER — Telehealth: Payer: Self-pay | Admitting: *Deleted

## 2014-08-20 NOTE — Telephone Encounter (Signed)
08/20/2014 Received phone call from patient's daughter stating that patient is on her last bottle of medication (Iressa). Daughter states that she just filled the pill boxes for the patient and believes there are about 30 days worth of medication left. Explained to daughter that study is currently in the process of being approved for another year, which allows for additional refills, and that I will be following up to make sure the prescription is available in a timely manner. Cindy S. Brigitte Pulse BSN, RN, [place] 08/20/2014 4:08 PM

## 2014-09-06 ENCOUNTER — Encounter: Payer: Self-pay | Admitting: *Deleted

## 2014-09-06 NOTE — Progress Notes (Signed)
09/06/2014 Annual prescription for IRESSA CAP signed by Dr. Marin Olp and faxed to [facility] for processing on 09/04/2014.  Received telephone call from [place] at [place] this afternoon confirming the prescription. Prescription will be processed for shipping directly to patient.  Cindy S. Brigitte Pulse BSN, RN, [facility] 09/06/2014 3:36 PM

## 2014-09-18 ENCOUNTER — Telehealth: Payer: Self-pay | Admitting: *Deleted

## 2014-09-18 NOTE — Telephone Encounter (Signed)
09/18/2014 Spoke with patient's daughter, Dorian Pod, by phone today to confirm that patient has received her new supply of study medication (Iressa). Daughter states shipment was sent to the [place] address. She has not yet opened that bottle but expects to use it within the next 1-2 weeks. Explained process for re-approval each February going forward, and noted that the pharmacy should be contacted as instructed on the bottle for refills (3 additional 90-day refills allowed). Daughter also encouraged to contact me when the last bottle is opened, in order to confirm timing so that drug supply is not interrupted. Cindy S. Brigitte Pulse BSN, RN, [place] 09/18/2014 3:23 PM

## 2014-12-31 ENCOUNTER — Other Ambulatory Visit: Payer: Self-pay | Admitting: Urology

## 2014-12-31 DIAGNOSIS — D494 Neoplasm of unspecified behavior of bladder: Secondary | ICD-10-CM

## 2015-01-06 ENCOUNTER — Telehealth: Payer: Self-pay | Admitting: Hematology & Oncology

## 2015-01-08 ENCOUNTER — Other Ambulatory Visit: Payer: Self-pay | Admitting: Urology

## 2015-01-08 ENCOUNTER — Ambulatory Visit (HOSPITAL_COMMUNITY)
Admission: RE | Admit: 2015-01-08 | Discharge: 2015-01-08 | Disposition: A | Discharge status: Home / Self Care | Payer: Medicare PPO | Source: Ambulatory Visit | Attending: Urology | Admitting: Urology

## 2015-01-08 DIAGNOSIS — D494 Neoplasm of unspecified behavior of bladder: Secondary | ICD-10-CM | POA: Insufficient documentation

## 2015-01-08 DIAGNOSIS — Z8541 Personal history of malignant neoplasm of cervix uteri: Secondary | ICD-10-CM | POA: Insufficient documentation

## 2015-01-08 DIAGNOSIS — Z85118 Personal history of other malignant neoplasm of bronchus and lung: Secondary | ICD-10-CM | POA: Diagnosis not present

## 2015-01-20 ENCOUNTER — Telehealth: Payer: Self-pay | Admitting: Nurse Practitioner

## 2015-01-20 ENCOUNTER — Other Ambulatory Visit (HOSPITAL_BASED_OUTPATIENT_CLINIC_OR_DEPARTMENT_OTHER): Payer: Medicare PPO

## 2015-01-20 ENCOUNTER — Ambulatory Visit (HOSPITAL_BASED_OUTPATIENT_CLINIC_OR_DEPARTMENT_OTHER): Payer: Medicare PPO | Admitting: Hematology & Oncology

## 2015-01-20 ENCOUNTER — Encounter: Payer: Self-pay | Admitting: Hematology & Oncology

## 2015-01-20 VITALS — BP 114/52 | HR 70 | Temp 97.7°F | Resp 14 | Wt 115.0 lb

## 2015-01-20 DIAGNOSIS — C770 Secondary and unspecified malignant neoplasm of lymph nodes of head, face and neck: Secondary | ICD-10-CM

## 2015-01-20 DIAGNOSIS — F0390 Unspecified dementia without behavioral disturbance: Secondary | ICD-10-CM | POA: Diagnosis not present

## 2015-01-20 DIAGNOSIS — C3492 Malignant neoplasm of unspecified part of left bronchus or lung: Secondary | ICD-10-CM

## 2015-01-20 DIAGNOSIS — C772 Secondary and unspecified malignant neoplasm of intra-abdominal lymph nodes: Principal | ICD-10-CM

## 2015-01-20 DIAGNOSIS — C679 Malignant neoplasm of bladder, unspecified: Secondary | ICD-10-CM | POA: Diagnosis not present

## 2015-01-20 DIAGNOSIS — Z8541 Personal history of malignant neoplasm of cervix uteri: Secondary | ICD-10-CM | POA: Diagnosis not present

## 2015-01-20 DIAGNOSIS — Z85118 Personal history of other malignant neoplasm of bronchus and lung: Secondary | ICD-10-CM

## 2015-01-20 LAB — CBC WITH DIFFERENTIAL (CANCER CENTER ONLY)
BASO#: 0 10*3/uL (ref 0.0–0.2)
BASO%: 0.4 % (ref 0.0–2.0)
EOS%: 2.2 % (ref 0.0–7.0)
Eosinophils Absolute: 0.1 10*3/uL (ref 0.0–0.5)
HCT: 36 % (ref 34.8–46.6)
HEMOGLOBIN: 11.9 g/dL (ref 11.6–15.9)
LYMPH#: 1.1 10*3/uL (ref 0.9–3.3)
LYMPH%: 25.2 % (ref 14.0–48.0)
MCH: 27.5 pg (ref 26.0–34.0)
MCHC: 33.1 g/dL (ref 32.0–36.0)
MCV: 83 fL (ref 81–101)
MONO#: 0.5 10*3/uL (ref 0.1–0.9)
MONO%: 11.1 % (ref 0.0–13.0)
NEUT%: 61.1 % (ref 39.6–80.0)
NEUTROS ABS: 2.8 10*3/uL (ref 1.5–6.5)
PLATELETS: 249 10*3/uL (ref 145–400)
RBC: 4.32 10*6/uL (ref 3.70–5.32)
RDW: 15.2 % (ref 11.1–15.7)
WBC: 4.5 10*3/uL (ref 3.9–10.0)

## 2015-01-20 LAB — CMP (CANCER CENTER ONLY)
ALK PHOS: 49 U/L (ref 26–84)
ALT(SGPT): 26 U/L (ref 10–47)
AST: 26 U/L (ref 11–38)
Albumin: 3.2 g/dL — ABNORMAL LOW (ref 3.3–5.5)
BUN, Bld: 15 mg/dL (ref 7–22)
CHLORIDE: 102 meq/L (ref 98–108)
CO2: 26 mEq/L (ref 18–33)
CREATININE: 1.5 mg/dL — AB (ref 0.6–1.2)
Calcium: 9.6 mg/dL (ref 8.0–10.3)
GLUCOSE: 92 mg/dL (ref 73–118)
Potassium: 4.1 mEq/L (ref 3.3–4.7)
SODIUM: 133 meq/L (ref 128–145)
Total Bilirubin: 0.7 mg/dl (ref 0.20–1.60)
Total Protein: 6.5 g/dL (ref 6.4–8.1)

## 2015-01-20 MED ORDER — BELLADONNA ALKALOIDS-OPIUM 16.2-30 MG RE SUPP: 30.0000 mg | Freq: Three times a day (TID) | RECTAL | Status: AC | PRN

## 2015-01-20 MED ORDER — FENTANYL 12 MCG/HR TD PT72: 12.5000 ug | MEDICATED_PATCH | TRANSDERMAL | Status: AC

## 2015-01-20 MED ORDER — OXYCODONE HCL 20 MG/ML PO CONC: 5.0000 mg | ORAL | Status: AC | PRN

## 2015-01-20 NOTE — Telephone Encounter (Signed)
Hospice home information given to [facility]. Office notes and demographic faxed to [phone] as requested.

## 2015-01-20 NOTE — Progress Notes (Signed)
Hematology and Oncology Follow Up Visit  Nancy Gray [id number] 12-08-1928 81 y.o. 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 Locally advanced/metastatic bladder cancer  Current Therapy:   Observation.     Interim History:  Ms.  Gray is in for an earlier visit. Unfortunately, as I she has metastatic bladder cancer. She was having some pelvic pain. She's having some hematuria. She saw her urologist. He Towanda Octave ordered an MRI. MRI showed a 5.7 x 2.6 x 5.2 mass along the bladder base. There is some extension involving the right UVJ. There was lymphadenopathy noted. There was a small amount of free pelvic fluid.  She has progressive dementia. Her performance status is ECOG 3-4. She's lost weight.  Because of her dementia, she really does not realize that she has a malignancy.  She comes in with her 3 daughters. They are all very aware of what is going on.  She is not capable of red making any decisions herself. Her daughters however state that she would not want to be kept alive on any life support machine. I think trying to keep her alive on a machine would not help her quality of life at all. I totally agree with this. Our goal is to make sure that she has comfort, respect and dignity. As such, she is a DO NOT RESUSCITATE  I really think that hospice will be necessary. I think hospice can really help the mouth.  His see my she is having some more pain. She's having a lot of ladder pressure.  I think it would be a good idea to try her on a fentanyl patch. I 12 g patch would be very reasonable. For any type of breakthrough pain, I would consider OxyFast liquid add 5 having 10 mg every 4 hours if necessary.  She may have some bleeding with the urine. If this becomes more of a problem, I would think that she will need a Foley catheter.  She's having no problems with cough. There is no shortness of breath.  Again, her  dementia is clearly the "driving factor" as to how aggressive we need to be.   Medications:  Current outpatient prescriptions:  .  ALPRAZolam (XANAX) 1 MG tablet, Take 1 mg by mouth at bedtime as needed for anxiety. 0.5 to 1 mg as needed, Disp: , Rfl:  .  B Complex Vitamins (B COMPLEX PO), Take by mouth daily. , Disp: , Rfl:  .  calcium carbonate (OS-CAL) 600 MG TABS, Take 600 mg by mouth 2 (two) times daily with a meal.  , Disp: , Rfl:  .  citalopram (CELEXA) 20 MG tablet, Take 20 mg by mouth daily. , Disp: , Rfl:  .  COMBIVENT RESPIMAT 20-100 MCG/ACT AERS respimat, Inhale 2 puffs into the lungs 2 (two) times daily., Disp: , Rfl: 5 .  donepezil (ARICEPT) 10 MG tablet, Take 10 mg by mouth daily. , Disp: , Rfl:  .  Loperamide HCl (IMODIUM PO), Take by mouth as needed., Disp: , Rfl:  .  memantine (NAMENDA) 10 MG tablet, Take 10 mg by mouth 2 (two) times daily., Disp: , Rfl: 5 .  Multiple Vitamins-Minerals (CENTRUM PO), Take 1 tablet by mouth daily.  , Disp: , Rfl:  .  Omega-3 Fatty Acids (FISH OIL PO), Take 1,000 mg by mouth daily. Px super epa fish oil caps , Disp: , Rfl:  .  VENTOLIN HFA 108 (90 BASE) MCG/ACT inhaler, Inhale 2  puffs into the lungs every 4 (four) hours as needed. , Disp: , Rfl:  .  fentaNYL (DURAGESIC) 12 MCG/HR, Place 1 patch (12.5 mcg total) onto the skin every 3 (three) days., Disp: 10 patch, Rfl: 0 .  opium-belladonna (B&O SUPPRETTES) 16.2-30 MG suppository, Place 1 suppository (30 mg total) rectally every 8 (eight) hours as needed for pain., Disp: 30 suppository, Rfl: 0 .  oxyCODONE (ROXICODONE INTENSOL) 20 MG/ML concentrated solution, Take 0.3-0.5 mLs (6-10 mg total) by mouth every 4 (four) hours as needed for severe pain., Disp: 30 mL, Rfl: 0  Allergies: No Known Allergies  Past Medical History, Surgical history, Social history, and Family History were reviewed and updated.  Review of Systems: As above  Physical Exam:  weight is 115 lb (52.164 kg). Her oral  temperature is 97.7 F (36.5 C). Her blood pressure is 114/52 and her pulse is 70. Her respiration is 14.   Elderly, petite white female. Lungs are clear. Head and neck exam shows no ocular or oral lesions. She has no adenopathy in the neck. Lungs are clear. Cardiac exam regular rate and rhythm with a normal S1 and S2. She has no murmurs rubs or bruits. Abdomen is soft. Has good bowel sounds. There is no fluid wave. There is no palpable liver or spleen tip. Back exam shows some slight kyphosis. No tenderness is noted over the spine ribs or hips. Extremities shows no clubbing cyanosis or edema. She has most likely an upper lower extremities. There may be some slight edema in the right lower leg.  Skin exam shows no rashes. Neurological exam shows the significant dementia.  Lab Results  Component Value Date   WBC 4.5 01/20/2015   HGB 11.9 01/20/2015   HCT 36.0 01/20/2015   MCV 83 01/20/2015   PLT 249 01/20/2015     Chemistry      Component Value Date/Time   NA 133 01/20/2015 1405   NA 139 07/24/2014 1534   K 4.1 01/20/2015 1405   K 4.0 07/24/2014 1534   CL 102 01/20/2015 1405   CL 105 07/24/2014 1534   CO2 26 01/20/2015 1405   CO2 26 07/24/2014 1534   BUN 15 01/20/2015 1405   BUN 20 07/24/2014 1534   CREATININE 1.5* 01/20/2015 1405   CREATININE 1.17* 07/24/2014 1534      Component Value Date/Time   CALCIUM 9.6 01/20/2015 1405   CALCIUM 9.8 07/24/2014 1534   ALKPHOS 49 01/20/2015 1405   ALKPHOS 39 07/24/2014 1534   AST 26 01/20/2015 1405   AST 23 07/24/2014 1534   ALT 26 01/20/2015 1405   ALT 20 07/24/2014 1534   BILITOT 0.70 01/20/2015 1405   BILITOT 0.6 07/24/2014 1534         Impression and Plan: Nancy Gray is 81 year old white female with a history of recurrent squamous cell carcinoma of the left supraclitoral region. She had radiation for this. She finished this back in November of 2012.  Again, our goal is comfort. Her family agrees.  We will call hospice. I think  they can provide a lot of support for the family.  For now, the family just wants to deal with our office. It would be easiest for them. I have no problems with this. I'm sure that Dr. Jeffie Pollock also would have no issues with this. He is on a fantastic job with her. He's done as much as he needs to do for her.  I have known Ms. Schiefelbein for 15-16 years. I just  hates that she has this dementia. However, in this case, the dementia will truly be a blessing for her as she just will not realize that she has cancer that is not treatable.  I told her daughters in private that I thought that she might have another 3 months. I think that her labs and weight loss will sternly give [place] an idea as to how quickly she will pass on.  For now, I don't think we have to her back to the office. With hospice when out to her house, I think this would provide a lot of support and we can do blood work at her home if necessary.  I spent about 45 minutes with she and her family.   [name], MD 7/11/20164:34 PM

## 2015-01-22 ENCOUNTER — Ambulatory Visit: Payer: Medicare HMO | Admitting: Hematology & Oncology

## 2015-01-22 ENCOUNTER — Other Ambulatory Visit: Payer: Medicare HMO

## 2015-01-22 ENCOUNTER — Encounter: Payer: Self-pay | Admitting: *Deleted

## 2015-01-22 DIAGNOSIS — C3492 Malignant neoplasm of unspecified part of left bronchus or lung: Secondary | ICD-10-CM

## 2015-01-23 ENCOUNTER — Telehealth: Payer: Self-pay | Admitting: *Deleted

## 2015-01-23 NOTE — Progress Notes (Signed)
Per Dr. Marin Olp, new diagnosis of bladder cancer is felt to be unlikely to be related to treatment with gefitinib.

## 2015-01-23 NOTE — Telephone Encounter (Signed)
01/23/2015 Contacted patient's daughter, Dorian Pod, today by phone after learning of new diagnosis of bladder cancer, per Dr. Marin Olp, on [phone]. Daughter confirms last dose of Iressa was taken on [id number]. Dorian Pod will retain remaining drug, consisting of two unopened bottles plus tablets from the patient's pill box, until further direction is received regarding investigational product return. Explained that study manager will be notified of last date of dosing, and that the specialty pharmacy will be notified that drug has been discontinued. Cindy S. Brigitte Pulse BSN, RN, [place] 01/23/2015 11:44 AM

## 2015-01-27 ENCOUNTER — Telehealth: Payer: Self-pay

## 2015-01-27 ENCOUNTER — Ambulatory Visit: Payer: Medicare PPO | Admitting: Hematology & Oncology

## 2015-01-27 ENCOUNTER — Other Ambulatory Visit: Payer: Medicare HMO

## 2015-01-27 NOTE — Telephone Encounter (Signed)
Received call from Cher Nakai, RN [phone]) with [facility] requesting standing orders for Senna & Miralax. OK per Dr Marin Olp. This information left on Stephanie's VM. dph

## 2015-01-29 ENCOUNTER — Encounter: Payer: Self-pay | Admitting: Nurse Practitioner

## 2015-01-29 NOTE — Progress Notes (Signed)
Standing orders and physician statement faxed back to [facility] at [phone]. Confirmation received.

## 2015-01-30 ENCOUNTER — Telehealth: Payer: Self-pay | Admitting: *Deleted

## 2015-01-30 NOTE — Telephone Encounter (Signed)
Melissa, RN with [facility] called to request and increase in pain medication. Patient is currently on fentanyl patch 12.62mg but this is not giving the patient full relief. She would like to increase the patch to 228m. Spoke with Dr EnMarin Olpho ordered and increase to fentanyl 2554mpatch every three days. [facility]

## 2015-02-26 ENCOUNTER — Encounter: Payer: Self-pay | Admitting: *Deleted

## 2015-02-26 DIAGNOSIS — C3492 Malignant neoplasm of unspecified part of left bronchus or lung: Secondary | ICD-10-CM

## 2015-02-26 NOTE — Progress Notes (Signed)
02/26/2015 Spoke with Leafy Ro at [facility] [phone]) to confirm return of study medication. Per Leafy Ro, call was received from patient's daughter, Dorian Pod, on 02/10/2015 requesting pickup of medication. Mandy confirmed with Joann Administrator, sports) that drug has been returned to the pharmacy. Cindy S. Brigitte Pulse BSN, RN, CCRP 02/26/2015 11:22 AM

## 2015-03-13 DEATH — deceased
